# Patient Record
Sex: Male | Born: 1970 | Race: White | Hispanic: No | Marital: Married | Smoking: Current every day smoker
Health system: Southern US, Community
[De-identification: ages and names within clinical notes are randomized; demographics above are authoritative.]

## PROBLEM LIST (undated history)

## (undated) DIAGNOSIS — I10 Essential (primary) hypertension: Secondary | ICD-10-CM

## (undated) DIAGNOSIS — F32A Depression, unspecified: Secondary | ICD-10-CM

## (undated) DIAGNOSIS — F419 Anxiety disorder, unspecified: Secondary | ICD-10-CM

## (undated) DIAGNOSIS — F1010 Alcohol abuse, uncomplicated: Secondary | ICD-10-CM

## (undated) DIAGNOSIS — F329 Major depressive disorder, single episode, unspecified: Secondary | ICD-10-CM

## (undated) HISTORY — PX: FINGER SURGERY: SHX640

---

## 1997-12-24 ENCOUNTER — Emergency Department (HOSPITAL_COMMUNITY): Admission: EM | Admit: 1997-12-24 | Discharge: 1997-12-24 | Payer: Self-pay | Admitting: Emergency Medicine

## 1998-03-20 ENCOUNTER — Ambulatory Visit (HOSPITAL_COMMUNITY): Admission: RE | Admit: 1998-03-20 | Discharge: 1998-03-20 | Payer: Self-pay | Admitting: Internal Medicine

## 2003-08-22 ENCOUNTER — Emergency Department (HOSPITAL_COMMUNITY): Admission: EM | Admit: 2003-08-22 | Discharge: 2003-08-22 | Payer: Self-pay | Admitting: Emergency Medicine

## 2012-09-17 ENCOUNTER — Encounter (HOSPITAL_COMMUNITY): Payer: Self-pay | Admitting: *Deleted

## 2012-09-17 ENCOUNTER — Emergency Department (HOSPITAL_COMMUNITY)
Admission: EM | Admit: 2012-09-17 | Discharge: 2012-09-18 | Disposition: A | Discharge status: Other Acute Inpatient Hospital | Payer: No Typology Code available for payment source | Attending: Emergency Medicine | Admitting: Emergency Medicine

## 2012-09-17 ENCOUNTER — Emergency Department (HOSPITAL_COMMUNITY): Payer: Self-pay

## 2012-09-17 DIAGNOSIS — F17200 Nicotine dependence, unspecified, uncomplicated: Secondary | ICD-10-CM | POA: Insufficient documentation

## 2012-09-17 DIAGNOSIS — F411 Generalized anxiety disorder: Secondary | ICD-10-CM | POA: Insufficient documentation

## 2012-09-17 DIAGNOSIS — F1010 Alcohol abuse, uncomplicated: Secondary | ICD-10-CM | POA: Insufficient documentation

## 2012-09-17 DIAGNOSIS — F329 Major depressive disorder, single episode, unspecified: Secondary | ICD-10-CM | POA: Insufficient documentation

## 2012-09-17 DIAGNOSIS — F3289 Other specified depressive episodes: Secondary | ICD-10-CM | POA: Insufficient documentation

## 2012-09-17 HISTORY — DX: Depression, unspecified: F32.A

## 2012-09-17 HISTORY — DX: Anxiety disorder, unspecified: F41.9

## 2012-09-17 HISTORY — DX: Major depressive disorder, single episode, unspecified: F32.9

## 2012-09-17 HISTORY — DX: Alcohol abuse, uncomplicated: F10.10

## 2012-09-17 LAB — RAPID URINE DRUG SCREEN, HOSP PERFORMED
Amphetamines: NOT DETECTED
Cocaine: NOT DETECTED
Opiates: NOT DETECTED
Tetrahydrocannabinol: NOT DETECTED

## 2012-09-17 LAB — CBC
HCT: 46.5 % (ref 39.0–52.0)
MCH: 37.6 pg — ABNORMAL HIGH (ref 26.0–34.0)
MCHC: 36.3 g/dL — ABNORMAL HIGH (ref 30.0–36.0)
MCV: 103.3 fL — ABNORMAL HIGH (ref 78.0–100.0)
Platelets: 200 10*3/uL (ref 150–400)

## 2012-09-17 LAB — COMPREHENSIVE METABOLIC PANEL
ALT: 72 U/L — ABNORMAL HIGH (ref 0–53)
AST: 109 U/L — ABNORMAL HIGH (ref 0–37)
Albumin: 3.8 g/dL (ref 3.5–5.2)
Calcium: 9.6 mg/dL (ref 8.4–10.5)
Chloride: 97 mEq/L (ref 96–112)
Creatinine, Ser: 0.66 mg/dL (ref 0.50–1.35)
GFR calc non Af Amer: >90 mL/min (ref >90)
Glucose, Bld: 83 mg/dL (ref 70–99)

## 2012-09-17 LAB — URINALYSIS, ROUTINE W REFLEX MICROSCOPIC
Nitrite: NEGATIVE
Protein, ur: 30 mg/dL — AB
Urobilinogen, UA: 1 mg/dL (ref 0.0–1.0)

## 2012-09-17 LAB — ETHANOL: Alcohol, Ethyl (B): <11 mg/dL (ref 0–11)

## 2012-09-17 MED ORDER — LORAZEPAM 1 MG PO TABS: 0.0000 mg | ORAL_TABLET | Freq: Two times a day (BID) | ORAL | Status: DC

## 2012-09-17 MED ORDER — LORAZEPAM 1 MG PO TABS: 1.0000 mg | ORAL_TABLET | Freq: Three times a day (TID) | ORAL | Status: DC | PRN

## 2012-09-17 MED ORDER — VITAMIN B-1 100 MG PO TABS
100.0000 mg | ORAL_TABLET | Freq: Every day | ORAL | Status: DC
  Administered 2012-09-17 – 2012-09-18 (×2): 100 mg via ORAL
  Filled 2012-09-17 (×2): qty 1

## 2012-09-17 MED ORDER — IBUPROFEN 200 MG PO TABS
600.0000 mg | ORAL_TABLET | Freq: Three times a day (TID) | ORAL | Status: DC | PRN
  Administered 2012-09-17: 600 mg via ORAL
  Filled 2012-09-17: qty 3

## 2012-09-17 MED ORDER — ZOLPIDEM TARTRATE 5 MG PO TABS
5.0000 mg | ORAL_TABLET | Freq: Every evening | ORAL | Status: DC | PRN
  Administered 2012-09-17: 5 mg via ORAL
  Filled 2012-09-17: qty 1

## 2012-09-17 MED ORDER — FOLIC ACID 1 MG PO TABS
1.0000 mg | ORAL_TABLET | Freq: Every day | ORAL | Status: DC
  Administered 2012-09-17 – 2012-09-18 (×2): 1 mg via ORAL
  Filled 2012-09-17 (×2): qty 1

## 2012-09-17 MED ORDER — THIAMINE HCL 100 MG/ML IJ SOLN: 100.0000 mg | Freq: Every day | INTRAMUSCULAR | Status: DC

## 2012-09-17 MED ORDER — ALUM & MAG HYDROXIDE-SIMETH 200-200-20 MG/5ML PO SUSP: 30.0000 mL | ORAL | Status: DC | PRN

## 2012-09-17 MED ORDER — NICOTINE 21 MG/24HR TD PT24
21.0000 mg | MEDICATED_PATCH | Freq: Every day | TRANSDERMAL | Status: DC
  Filled 2012-09-17: qty 1

## 2012-09-17 MED ORDER — ADULT MULTIVITAMIN W/MINERALS CH
1.0000 | ORAL_TABLET | Freq: Every day | ORAL | Status: DC
  Administered 2012-09-17 – 2012-09-18 (×2): 1 via ORAL
  Filled 2012-09-17 (×2): qty 1

## 2012-09-17 MED ORDER — ONDANSETRON HCL 4 MG PO TABS: 4.0000 mg | ORAL_TABLET | Freq: Three times a day (TID) | ORAL | Status: DC | PRN

## 2012-09-17 MED ORDER — LORAZEPAM 1 MG PO TABS
0.0000 mg | ORAL_TABLET | Freq: Four times a day (QID) | ORAL | Status: DC
  Administered 2012-09-17: 0 mg via ORAL
  Filled 2012-09-17: qty 1
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
## 2012-09-17 NOTE — Progress Notes (Signed)
[facility] provided pt with a [employer] Orange Card application, highlighting [facility].  °

## 2012-09-17 NOTE — ED Provider Notes (Signed)
CSN: [id number]     Arrival date & time 09/17/12  1402 History   First MD Initiated Contact with Patient 09/17/12 1518     Chief Complaint  Patient presents with  . Medical Clearance   (Consider location/radiation/quality/duration/timing/severity/associated sxs/prior Treatment) HPI Comments: Jonathon Ray is a 42 y.o. male  with a hx of alcohol abuse, anxiety and depression presents to the Emergency Department requesting detox from alcohol abuse. Patient states he drinks 3-5 glasses of vodka every day for the last 4-5 years. 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 He reports that he attempted to stop drinking on his own last week and on Friday had an episode which he characterizes as a seizure. Patient reports he had been 48 hours without alcohol at that time. He states his jaw got tight and he bit his tongue.  Patient reports that the friend who was with him denied seeing any shaking, but patient is certain that he passed out.  He had no loss of bowel or bladder. Patient was not transported for evaluation at that time. He reports no history of seizures. Patient reports he has not seen a primary care doctor in many years and has no known medical problems; however the chart lists anxiety and depression.  He denies associated drug use with his alcohol intake. He also denies suicidal ideation, homicidal ideation, audio and visual hallucinations. Patient's last drink was 2 PM yesterday and was vodka.  Patient reports feeling generally depressed without specifics and thinks this is why he drinks. He requests evaluation for his depression as well.  Patient denies all somatic complaints.  The history is provided by the patient and medical records. No language interpreter was used.    Past Medical History  Diagnosis Date  . Alcohol abuse   . Anxiety   . Depression    Past Surgical History  Procedure Laterality Date  . Finger surgery     No  family history on file. History  Substance Use Topics  . Smoking status: Current Every Day Smoker  . Smokeless tobacco: Never Used  . Alcohol Use: Yes     Comment: 3 drinks of vodka a day    Review of Systems  Constitutional: Negative for fever, diaphoresis, appetite change, fatigue and unexpected weight change.  HENT: Negative for mouth sores and neck stiffness.   Eyes: Negative for visual disturbance.  Respiratory: Negative for cough, chest tightness, shortness of breath and wheezing.   Cardiovascular: Negative for chest pain.  Gastrointestinal: Negative for nausea, vomiting, abdominal pain, diarrhea and constipation.  Endocrine: Negative for polydipsia, polyphagia and polyuria.  Genitourinary: Negative for dysuria, urgency, frequency and hematuria.  Musculoskeletal: Negative for back pain.  Skin: Negative for rash.  Allergic/Immunologic: Negative for immunocompromised state.  Neurological: Negative for syncope, light-headedness and headaches.  Hematological: Does not bruise/bleed easily.  Psychiatric/Behavioral: Negative for sleep disturbance. The patient is not nervous/anxious.     Allergies  Review of patient's allergies indicates no known allergies.  Home Medications   Current Outpatient Rx  Name  Route  Sig  Dispense  Refill  . diphenhydramine-acetaminophen (TYLENOL PM) 25-500 MG TABS   Oral   Take 2 tablets by mouth at bedtime as needed (for sleep).          BP 160/107  Pulse 108  Temp(Src) 99 F (37.2 C) (Oral)  Resp 17  Ht 5\' 8"  (1.727 m)  Wt 197 lb (89.359 kg)  BMI  29.96 kg/m2  SpO2 98% Physical Exam  Nursing note and vitals reviewed. Constitutional: He is oriented to person, place, and time. He appears well-developed and well-nourished. No distress.  Awake, alert, nontoxic appearance  HENT:  Head: Normocephalic and atraumatic.  Mouth/Throat: Oropharynx is clear and moist. No oropharyngeal exudate.  Eyes: Conjunctivae are normal. Pupils are equal,  round, and reactive to light. No scleral icterus.  Neck: Normal range of motion. Neck supple.  Cardiovascular: Normal rate, regular rhythm, normal heart sounds and intact distal pulses.   No murmur heard. Pulmonary/Chest: Effort normal and breath sounds normal. No respiratory distress. He has no wheezes.  Abdominal: Soft. Bowel sounds are normal. He exhibits no distension. There is no tenderness.  Musculoskeletal: Normal range of motion. He exhibits no edema.  Lymphadenopathy:    He has no cervical adenopathy.  Neurological: He is alert and oriented to person, place, and time. He exhibits normal muscle tone. Coordination normal.  Speech is clear and goal oriented, follows commands Major Cranial nerves without deficit, no facial droop Normal strength in upper and lower extremities bilaterally including dorsiflexion and plantar flexion, strong and equal grip strength Sensation normal to light and sharp touch Moves extremities without ataxia, coordination intact Normal finger to nose and rapid alternating movements Neg romberg, no pronator drift Normal gait and balance  Skin: Skin is warm and dry. No rash noted. He is not diaphoretic. No erythema.  Psychiatric: He has a normal mood and affect. His speech is normal and behavior is normal.    ED Course  Procedures (including critical care time) Labs Review Labs Reviewed  CBC - Abnormal; Notable for the following:    MCV 103.3 (*)    MCH 37.6 (*)    MCHC 36.3 (*)    All other components within normal limits  COMPREHENSIVE METABOLIC PANEL - Abnormal; Notable for the following:    AST 109 (*)    ALT 72 (*)    Total Bilirubin 1.3 (*)    All other components within normal limits  SALICYLATE LEVEL - Abnormal; Notable for the following:    Salicylate Lvl <2.0 (*)    All other components within normal limits  URINALYSIS, ROUTINE W REFLEX MICROSCOPIC - Abnormal; Notable for the following:    Color, Urine ORANGE (*)    pH 8.5 (*)     Bilirubin Urine SMALL (*)    Ketones, ur >80 (*)    Protein, ur 30 (*)    Leukocytes, UA SMALL (*)    All other components within normal limits  ACETAMINOPHEN LEVEL  ETHANOL  URINE RAPID DRUG SCREEN (HOSP PERFORMED)  URINE MICROSCOPIC-ADD ON   Imaging Review Ct Head Wo Contrast  09/17/2012   *RADIOLOGY REPORT*  Clinical Data: Recent seizure  CT HEAD WITHOUT CONTRAST  Technique:  Contiguous axial images were obtained from the base of the skull through the vertex without contrast. Study was obtained within 24 hours of patient arrival at the emergency department.  Comparison: None.  Findings: The ventricles are normal in size and configuration. There is no mass, hemorrhage, extra-axial fluid collection, or midline shift.  There is minimal small vessel disease adjacent to the frontal horns of the lateral ventricles bilaterally. Elsewhere, gray-white compartments are normal.  No demonstrable acute infarct.  Bony calvarium appears intact.  The mastoid air cells are clear.  IMPRESSION: Rather minimal periventricular small vessel disease.  Study otherwise unremarkable.   Original Report Authenticated By: Bretta Bang, M.D.    MDM   1. Alcohol  abuse   2. Depression      Garet Hooton presents with request for EtOH detox.  Concern for possible withdrawl seizure last week. Will obtain head Ct before pt is medically cleared.    Head CT negative.  UDS negative, UA without evidence of urinary tract infection, CBC unremarkable, CMP with mildly elevated liver enzymes.  Patient has been medically cleared in the ED and is awaiting consult by ACT team for possible placement vs OP clinic information. Pt is currently not having SI or HI and appears stable in NAD. Pt is cleared to be [facility] back to [facility].    BP 160/107  Pulse 108  Temp(Src) 99 F (37.2 C) (Oral)  Resp 17  Ht 5\' 8"  (1.727 m)  Wt 197 lb (89.359 kg)  BMI 29.96 kg/m2  SpO2 98%   Dierdre Forth, PA-C 09/17/12 2000

## 2012-09-17 NOTE — ED Provider Notes (Signed)
Medical screening examination/treatment/procedure(s) were performed by non-physician practitioner and as supervising physician I was immediately available for consultation/collaboration.  Abir Eroh R. Bryton Waight, MD 09/17/12 2341 

## 2012-09-17 NOTE — ED Provider Notes (Signed)
Patient with hypertension. Unknown the same. We'll give milligram Ativan and reevaluate.  Jonathon Ray. Rubin Payor, MD 09/17/12 1859

## 2012-09-17 NOTE — BH Assessment (Signed)
Consulted with AC Thurman Coyer and Irish Elders who agreed to accept patient to [facility] for detox treatment once his BP is stable and once there is an available bed. TTS will need to seek placement elsewhere.  Spoke with Neysa Bonito at [facility] who reports no male detox beds at this time.   Glorious Peach, MS, LCASA Assessment Counselor

## 2012-09-17 NOTE — ED Notes (Signed)
Pt states has been drinking vodka x years, states usually had 3 drinks a day, states last Friday stopped drinking, states he had a seizure that Friday, then over the weekend started drinking again, states last drink was yesterday around 2 pm, pt states right now he feels like he's withdrawing, states he feels shaky.

## 2012-09-17 NOTE — Treatment Plan (Signed)
Pt accepted to [facility] by Burkett, NP to a 300 hall bed (which is not available tonight) pending stabilization of BP.  Thank you.

## 2012-09-17 NOTE — ED Notes (Signed)
Pt reports he "always has " high BP, but is not on anything

## 2012-09-17 NOTE — ED Notes (Addendum)
Dr pickering aware of bp-give 1 mg ativan for CIWA

## 2012-09-18 ENCOUNTER — Inpatient Hospital Stay (HOSPITAL_COMMUNITY)
Admission: AD | Admit: 2012-09-18 | Discharge: 2012-09-24 | DRG: 897 | Disposition: A | Discharge status: Home / Self Care | Payer: No Typology Code available for payment source | Source: Intra-hospital | Attending: Psychiatry | Admitting: Psychiatry

## 2012-09-18 DIAGNOSIS — F1994 Other psychoactive substance use, unspecified with psychoactive substance-induced mood disorder: Secondary | ICD-10-CM | POA: Diagnosis present

## 2012-09-18 DIAGNOSIS — F321 Major depressive disorder, single episode, moderate: Secondary | ICD-10-CM | POA: Diagnosis present

## 2012-09-18 DIAGNOSIS — F411 Generalized anxiety disorder: Secondary | ICD-10-CM | POA: Diagnosis present

## 2012-09-18 DIAGNOSIS — F17200 Nicotine dependence, unspecified, uncomplicated: Secondary | ICD-10-CM | POA: Diagnosis present

## 2012-09-18 DIAGNOSIS — F1010 Alcohol abuse, uncomplicated: Secondary | ICD-10-CM

## 2012-09-18 DIAGNOSIS — F1020 Alcohol dependence, uncomplicated: Principal | ICD-10-CM

## 2012-09-18 DIAGNOSIS — F3289 Other specified depressive episodes: Secondary | ICD-10-CM

## 2012-09-18 DIAGNOSIS — G4700 Insomnia, unspecified: Secondary | ICD-10-CM | POA: Diagnosis present

## 2012-09-18 DIAGNOSIS — F329 Major depressive disorder, single episode, unspecified: Secondary | ICD-10-CM

## 2012-09-18 MED ORDER — ONDANSETRON HCL 4 MG PO TABS: 4.0000 mg | ORAL_TABLET | Freq: Three times a day (TID) | ORAL | Status: DC | PRN

## 2012-09-18 MED ORDER — ADULT MULTIVITAMIN W/MINERALS CH
1.0000 | ORAL_TABLET | Freq: Every day | ORAL | Status: DC
  Filled 2012-09-18 (×3): qty 1

## 2012-09-18 MED ORDER — ALUM & MAG HYDROXIDE-SIMETH 200-200-20 MG/5ML PO SUSP: 30.0000 mL | ORAL | Status: DC | PRN

## 2012-09-18 MED ORDER — FOLIC ACID 1 MG PO TABS
1.0000 mg | ORAL_TABLET | Freq: Every day | ORAL | Status: DC
  Administered 2012-09-20 – 2012-09-23 (×4): 1 mg via ORAL
  Filled 2012-09-18 (×7): qty 1

## 2012-09-18 MED ORDER — MAGNESIUM HYDROXIDE 400 MG/5ML PO SUSP: 30.0000 mL | Freq: Every day | ORAL | Status: DC | PRN

## 2012-09-18 MED ORDER — ACETAMINOPHEN 325 MG PO TABS
650.0000 mg | ORAL_TABLET | Freq: Four times a day (QID) | ORAL | Status: DC | PRN
  Administered 2012-09-19: 650 mg via ORAL

## 2012-09-18 MED ORDER — VITAMIN B-1 100 MG PO TABS
100.0000 mg | ORAL_TABLET | Freq: Every day | ORAL | Status: DC
  Filled 2012-09-18 (×3): qty 1

## 2012-09-18 MED ORDER — NICOTINE 21 MG/24HR TD PT24
21.0000 mg | MEDICATED_PATCH | Freq: Every day | TRANSDERMAL | Status: DC
  Administered 2012-09-19 – 2012-09-23 (×5): 21 mg via TRANSDERMAL
  Filled 2012-09-18 (×7): qty 1

## 2012-09-18 MED ORDER — LORAZEPAM 1 MG PO TABS: 1.0000 mg | ORAL_TABLET | Freq: Three times a day (TID) | ORAL | Status: DC | PRN

## 2012-09-18 MED ORDER — THIAMINE HCL 100 MG/ML IJ SOLN: 100.0000 mg | Freq: Every day | INTRAMUSCULAR | Status: DC

## 2012-09-18 MED ORDER — ZOLPIDEM TARTRATE 5 MG PO TABS
5.0000 mg | ORAL_TABLET | Freq: Every evening | ORAL | Status: DC | PRN
  Administered 2012-09-18 – 2012-09-19 (×2): 5 mg via ORAL
  Filled 2012-09-18 (×2): qty 1

## 2012-09-18 MED ORDER — LORAZEPAM 1 MG PO TABS
0.0000 mg | ORAL_TABLET | Freq: Four times a day (QID) | ORAL | Status: DC
  Administered 2012-09-18 – 2012-09-19 (×3): 1 mg via ORAL
  Filled 2012-09-18: qty 1
  Filled 2012-09-18: qty 2
  Filled 2012-09-18: qty 1

## 2012-09-18 MED ORDER — LORAZEPAM 1 MG PO TABS: 0.0000 mg | ORAL_TABLET | Freq: Two times a day (BID) | ORAL | Status: DC
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
## 2012-09-18 NOTE — ED Notes (Signed)
Pt reports that Friday was his first and only seizure and it's not sure what the incident was related to exactly. He said he hadn't had ETOH in 24 hours when it occurred but that he's gone 24 hours without ETOH before and didn't have a seizure. He also reports that every time his BP is checked he's told it's high but has never been started on an anti-hypertensive medication nor referred to a cardiologist. He denies SI/HI/AVH. He also denied any withdrawal symptoms.

## 2012-09-18 NOTE — Progress Notes (Signed)
Patient ID: Jonathon Ray, male   DOB: 08-13-1970, 42 y.o.   MRN: [id number] D)  Came up to med window to ask what he had scheduled this evening.  Didn't have anything scheduled at the time, encouraged him to go to group and to come back later for hs meds.  Stated he has been to group before, didn't get anything out of it, doesn't expect to tonight, stated groups don't help.  Encouraged to go and to at least listen.  Denies having w/d sx. Held out hands, slight tremor noted but he stated it isn't related to w/d. Asked about shaving in the am, was told he would need to have a tech present.  Seemed angry at that, stated he was here for detox, not suicide. A)  Will continue to monitor for safety, continue POC, encourage to participate in the milieu. R)  Skeptical, sarcastic, safety maintained

## 2012-09-18 NOTE — Progress Notes (Signed)
Patient ID: Jonathon Ray, male   DOB: 03/20/1970, 42 y.o.   MRN: [id number]  Pt is angry with staff during the whole admission process, pt states "Y'all treat me like I'm in prison, if I had known it would be like this then I would not have come in here for help.  I can stop drinking on my own.  I don't need this horrible treatment.  All of your policy and procedures are stupid."  Pt is resistant to answer questions and is becoming increasingly agitated.  Pt states that he "will get out of here." Pt denies SI/HI/AVH.  Oriented to unit and rules.  Pt seems to be upset with every rule/policy that is in place stating that we aren't "treating him like a human being because I can't even have my belt or walk outside whenever I want to!"  [facility] notified and in search room talking with pt.

## 2012-09-19 DIAGNOSIS — F329 Major depressive disorder, single episode, unspecified: Secondary | ICD-10-CM

## 2012-09-19 DIAGNOSIS — F1020 Alcohol dependence, uncomplicated: Principal | ICD-10-CM

## 2012-09-19 MED ORDER — HYDROXYZINE HCL 25 MG PO TABS: 25.0000 mg | ORAL_TABLET | Freq: Four times a day (QID) | ORAL | Status: AC | PRN

## 2012-09-19 MED ORDER — THIAMINE HCL 100 MG/ML IJ SOLN
100.0000 mg | Freq: Once | INTRAMUSCULAR | Status: AC
  Administered 2012-09-19: 100 mg via INTRAMUSCULAR

## 2012-09-19 MED ORDER — CHLORDIAZEPOXIDE HCL 25 MG PO CAPS
25.0000 mg | ORAL_CAPSULE | ORAL | Status: AC
  Administered 2012-09-22 (×2): 25 mg via ORAL
  Filled 2012-09-19 (×2): qty 1

## 2012-09-19 MED ORDER — CHLORDIAZEPOXIDE HCL 25 MG PO CAPS
25.0000 mg | ORAL_CAPSULE | Freq: Every day | ORAL | Status: AC
  Administered 2012-09-23: 25 mg via ORAL
  Filled 2012-09-19: qty 1

## 2012-09-19 MED ORDER — ONDANSETRON 4 MG PO TBDP: 4.0000 mg | ORAL_TABLET | Freq: Four times a day (QID) | ORAL | Status: AC | PRN

## 2012-09-19 MED ORDER — CHLORDIAZEPOXIDE HCL 25 MG PO CAPS
25.0000 mg | ORAL_CAPSULE | Freq: Four times a day (QID) | ORAL | Status: AC
  Administered 2012-09-19 – 2012-09-20 (×6): 25 mg via ORAL
  Filled 2012-09-19 (×6): qty 1

## 2012-09-19 MED ORDER — VITAMIN B-1 100 MG PO TABS
100.0000 mg | ORAL_TABLET | Freq: Every day | ORAL | Status: DC
  Administered 2012-09-20 – 2012-09-23 (×4): 100 mg via ORAL
  Filled 2012-09-19 (×6): qty 1

## 2012-09-19 MED ORDER — LOPERAMIDE HCL 2 MG PO CAPS: 2.0000 mg | ORAL_CAPSULE | ORAL | Status: AC | PRN

## 2012-09-19 MED ORDER — CHLORDIAZEPOXIDE HCL 25 MG PO CAPS
25.0000 mg | ORAL_CAPSULE | Freq: Four times a day (QID) | ORAL | Status: AC | PRN
  Administered 2012-09-20: 25 mg via ORAL
  Filled 2012-09-19: qty 1

## 2012-09-19 MED ORDER — SERTRALINE HCL 25 MG PO TABS
25.0000 mg | ORAL_TABLET | Freq: Every day | ORAL | Status: DC
  Administered 2012-09-19 – 2012-09-22 (×4): 25 mg via ORAL
  Filled 2012-09-19 (×8): qty 1

## 2012-09-19 MED ORDER — ADULT MULTIVITAMIN W/MINERALS CH
1.0000 | ORAL_TABLET | Freq: Every day | ORAL | Status: DC
  Administered 2012-09-19 – 2012-09-23 (×5): 1 via ORAL
  Filled 2012-09-19 (×7): qty 1

## 2012-09-19 MED ORDER — CHLORDIAZEPOXIDE HCL 25 MG PO CAPS
25.0000 mg | ORAL_CAPSULE | Freq: Three times a day (TID) | ORAL | Status: AC
  Administered 2012-09-21 (×3): 25 mg via ORAL
  Filled 2012-09-19 (×3): qty 1

## 2012-09-19 NOTE — BHH Suicide Risk Assessment (Signed)
Suicide Risk Assessment  Admission Assessment     Nursing information obtained from:    Chart Demographic factors:   Caucasian, Alcohol dependence, Divorced/seperated Current Mental Status:    Blood pressure 121/89, pulse 108, temperature 98 F (36.7 C), temperature source Oral, resp. rate 17, height 5\' 8"  (1.727 m), weight 87.998 kg (194 lb).Body mass index is 29.5 kg/(m^2).   General Appearance: Casual and Well Groomed   Eye Contact:: Good   Speech: Clear and Coherent and Normal Rate   Volume: Normal   Mood: Anxious   Affect: Appropriate, Congruent and Full Range   Thought Process: Circumstantial, Linear and Logical   Orientation: Full (Time, Place, and Person)   Thought Content: WDL   Suicidal Thoughts: No   Homicidal Thoughts: No   Memory: Immediate; Good  Recent; Good  Remote; Good   Judgement: Fair   Insight: Fair   Psychomotor Activity: Normal   Concentration: Fair   Recall: Poor   Akathisia: Negative   Handed: Ambidextrous   AIMS (if indicated): Not indicated   Assets: Communication Skills  Desire for Improvement  Housing   Sleep: Number of Hours: 5.75   Past Psychiatric History:  Diagnosis:   Hospitalizations:   Outpatient Care:   Substance Abuse Care:   Self-Mutilation:   Suicidal Attempts:   Violent Behaviors:    Loss Factors:   recent loss of job. Recent loss of emotional support. Historical Factors:   None Risk Reduction Factors:   Children, no prior suicide attempts  CLINICAL FACTORS:   Depression:   Anhedonia Alcohol/Substance Abuse/Dependencies  COGNITIVE FEATURES THAT CONTRIBUTE TO RISK:  Thought constriction (tunnel vision)    SUICIDE RISK:   Minimal: No identifiable suicidal ideation.  Patients presenting with no risk factors but with morbid ruminations; may be classified as minimal risk based on the severity of the depressive symptoms  PLAN OF CARE: AXIS I:   Substance/Addictive Disorders:  Alcohol Related Disorder - Severe  (303.90) Depressive Disorders:  Depression, Not Elsewhere classified. AXIS II:  No diagnosis AXIS III:   Past Medical History  Diagnosis Date  . Alcohol abuse   . Anxiety   . Depression    AXIS IV:  other psychosocial or environmental problems and problems with primary support group AXIS V:  21-30 behavior considerably influenced by delusions or hallucinations OR serious impairment in judgment, communication OR inability to function in almost all areas  Treatment Plan/Recommendations:  Detox protocol.   Treatment Plan Summary: Daily contact with patient to assess and evaluate symptoms and progress in treatment Medication management Current Medications:  Current Facility-Administered Medications  Medication Dose Route Frequency Provider Last Rate Last Dose  . acetaminophen (TYLENOL) tablet 650 mg  650 mg Oral Q6H PRN Earney Navy, NP   650 mg at 09/19/12 1444  . alum & mag hydroxide-simeth (MAALOX/MYLANTA) 200-200-20 MG/5ML suspension 30 mL  30 mL Oral Q4H PRN Earney Navy, NP      . folic acid (FOLVITE) tablet 1 mg  1 mg Oral Daily Earney Navy, NP      . LORazepam (ATIVAN) tablet 0-4 mg  0-4 mg Oral Q6H Earney Navy, NP   1 mg at 09/19/12 0631   Followed by  . [START ON 09/20/2012] LORazepam (ATIVAN) tablet 0-4 mg  0-4 mg Oral Q12H Josephine C Onuoha, NP      . LORazepam (ATIVAN) tablet 1 mg  1 mg Oral Q8H PRN Earney Navy, NP      . magnesium hydroxide (  MILK OF MAGNESIA) suspension 30 mL  30 mL Oral Daily PRN Earney Navy, NP      . multivitamin with minerals tablet 1 tablet  1 tablet Oral Daily Earney Navy, NP      . nicotine (NICODERM CQ - dosed in mg/24 hours) patch 21 mg  21 mg Transdermal Daily Earney Navy, NP      . ondansetron (ZOFRAN) tablet 4 mg  4 mg Oral Q8H PRN Earney Navy, NP      . thiamine (VITAMIN B-1) tablet 100 mg  100 mg Oral Daily Earney Navy, NP       Or  . thiamine (B-1) injection 100 mg  100 mg  Intravenous Daily Earney Navy, NP      . zolpidem (AMBIEN) tablet 5 mg  5 mg Oral QHS PRN Earney Navy, NP   5 mg at 09/18/12 2152    Observation Level/Precautions:  Detox  Laboratory:  Reviewed  Psychotherapy:  Group and AA  Medications:  Will start detox protocol. Will start sertraline 25 mg now, for depressive symptoms.  Consultations:  none  Discharge Concerns:  Relapse  Estimated LOS: 3-5 days  Other:  Referral to [facility] or other rehabilitation facility.     I certify that inpatient services furnished can reasonably be expected to improve the patient's condition.  Jonathon Ray 09/19/2012, 3:20 PM
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
## 2012-09-19 NOTE — Progress Notes (Signed)
Psychoeducational Group Note  Date:  09/19/2012 Time:  0945 am  Group Topic/Focus:  Identifying Needs:   The focus of this group is to help patients identify their personal needs that have been historically problematic and identify healthy behaviors to address their needs.  Participation Level:  Active  Participation Quality:  Appropriate  Affect:  Appropriate  Cognitive:  Alert  Insight:  Improving  Engagement in Group:  Improving  Additional Comments:    Andrena Mews 09/19/2012,6:35 PM

## 2012-09-19 NOTE — Progress Notes (Signed)
Adult Psychoeducational Group Note  Date:  09/19/2012 Time:  0830  Group Topic/Focus:  Goals Group:   The focus of this group is to help patients establish daily goals to achieve during treatment and discuss how the patient can incorporate goal setting into their daily lives to aide in recovery.  Participation Level:  Active  Participation Quality:  Appropriate  Affect:  Appropriate  Cognitive:  Appropriate  Insight: Appropriate  Engagement in Group:  Engaged  Modes of Intervention:  Discussion  Additional Comments:    Roselee Culver 09/19/2012, 10:16 AM

## 2012-09-19 NOTE — [facility] Group Notes (Signed)
[facility] Group Notes:  (Clinical Social Work)  09/19/2012     10-11AM  Summary of Progress/Problems:   The main focus of today's process group was for the patient to identify ways in which they have in the past sabotaged their own recovery. Motivational Interviewing was utilized to ask the group members what they get out of their substance use, and what reasons they may have for wanting to change.  The Stages of Change were explained using a handout, and patients identified where they currently are with regard to stages of change.  The patient expressed that he is a functioning alcoholic, that he started out drinking for depression and anxiety and continued because he is a sociable person.  Now he has a girlfriend and 2 sons, is getting older and wants to change.  Believes he is in Preparation Stage.  Type of Therapy:  Group Therapy - Process   Participation Level:  Minimal  Participation Quality:  Attentive  Affect:  Flat  Cognitive:  Oriented  Insight:  Developing/Improving  Engagement in Therapy:  Developing/Improving  Modes of Intervention:  Education, Support and Processing, Motivational Interviewing  Ambrose Mantle, LCSW 09/19/2012, 12:55 PM

## 2012-09-19 NOTE — H&P (Addendum)
Psychiatric Admission Assessment Adult  Patient Identification:  Jonathon Ray Date of Evaluation:  09/19/2012 Chief Complaint:  ALCOHOL DEPENDENCE ETOH 303.90  History of Present Illness: Mr. Jonathon Ray is a 42 y/o male.  Per his ED psychiatric assessment. "Jonathon Ray is a 42 y.o. male with a hx of alcohol abuse, anxiety and depression presents to the Emergency Department requesting detox from alcohol abuse. Patient states he drinks 3-5 glasses of vodka every day for the last 4-5 years. 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 He reports that he attempted to stop drinking on his own last week and on Friday and he described what look as a seizure. Patient reports he had been 48 hours without alcohol at that time. He states his jaw got tight and he bit his tongue. He had no loss of bowel or bladder. Patient was not transported for evaluation at that time. He reports no history of seizures. Patient reports he has not seen a primary care doctor in many years and has no known medical problems; however, he reports depressed mood for most of life and anxiety especially while withdrawing from alcohol. He denies associated drug use with his alcohol intake. He also denies suicidal ideation, homicidal ideation, audio and visual hallucinations. Patient's last drink was 2 PM yesterday and was vodka. Patient reports He drinks to treat his depression. He does not have a psychiatrist and denies been treated for alcohol in the past. His motivation for seeking treatment now is to be able to be able to go out and look for a new job. During the interview patient's affect was flat, angry and sad."    Elements:  Location:  The patient reports he has been drinking since he was drinking since age 67. He went through a seperation 4 years ago.. Quality:  The patient endorses a worsening of his mood over the past 5 month when his girlfriend ended the  relationship.. Severity: Depression: 5/10 (0=Very depressed; 5=Neutral; 10=Very Happy)  Anxiety- 7/10 (0=no anxiety; 5= moderate/tolerable anxiety; 10= panic attacks) Timing:  The patient denies any specific time. Duration: The patient has had symptoms of depression for the past 4 years after his separation from his wife and gradually worsened over the past 5 months after his girlfriend ended the relationship. Context:  Alcohol use.   Associated Signs/Synptoms: Depression Symptoms:  depressed mood, anhedonia, hopelessness, suicidal thoughts without plan, (Hypo) Manic Symptoms: None Anxiety Symptoms:  Excessive Worry, Psychotic Symptoms:  Patient denies PTSD Symptoms: Negative  Psychiatric Specialty Exam: Physical Exam I have reviewed the physical finding performed in the ED and  Agree with the findings.  ROS  Blood pressure 121/89, pulse 108, temperature 98 F (36.7 C), temperature source Oral, resp. rate 17, height 5\' 8"  (1.727 m), weight 87.998 kg (194 lb).Body mass index is 29.5 kg/(m^2).  General Appearance: Casual and Well Groomed  Eye Contact::  Good  Speech:  Clear and Coherent and Normal Rate  Volume:  Normal  Mood:  Anxious  Affect:  Appropriate, Congruent and Full Range  Thought Process:  Circumstantial, Linear and Logical  Orientation:  Full (Time, Place, and Person)  Thought Content:  WDL  Suicidal Thoughts:  No  Homicidal Thoughts:  No  Memory:  Immediate;   Good Recent;   Good Remote;   Good  Judgement:  Fair  Insight:  Fair  Psychomotor Activity:  Normal  Concentration:  Fair  Recall:  Poor  Akathisia:  Negative  Handed:  Ambidextrous  AIMS (if indicated):   Not indicated  Assets:  Communication Skills Desire for Improvement Housing  Sleep:  Number of Hours: 5.75    Past Psychiatric History: Diagnosis:  Hospitalizations:  Outpatient Care:  Substance Abuse Care:  Self-Mutilation:  Suicidal Attempts:  Violent Behaviors:   Past Medical History:    Past Medical History  Diagnosis Date  . Alcohol abuse   . Anxiety   . Depression    Loss of Consciousness:  With drinking Seizure History:  Patient is unsure Cardiac History:  Patient denies Traumatic Brain Injury:  Patient was a boxer Allergies:  No Known Allergies PTA Medications: Prescriptions prior to admission  Medication Sig Dispense Refill  . diphenhydramine-acetaminophen (TYLENOL PM) 25-500 MG TABS Take 2 tablets by mouth at bedtime as needed (for sleep).        Previous Psychotropic Medications:  Medication/Dose  None               Substance Abuse History in the last 12 months:  yes  Consequences of Substance Abuse: Medical Consequences:  Yes Legal Consequences:  No Family Consequences:  Yes Blackouts:   DT's: Withdrawal Symptoms:   Diarrhea Headaches Nausea Vomiting  Social History:  reports that he has been smoking.  He has never used smokeless tobacco. He reports that  drinks alcohol. He reports that he does not use illicit drugs. Additional Social History:  Current Place of Residence:   Family Members: Lives by himself. Marital Status:  Separated Children: 2  Sons:2 Relationships: No source of emotional support. Education:  Cardinal Health Problems/Performance:  none History of Abuse (Emotional/Phsycial/Sexual): Patient denies. 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 Legal History: None  Family History:  No family history on file.  Results for orders placed during the hospital encounter of 09/17/12 (from the past 72 hour(s))  ACETAMINOPHEN LEVEL     Status: None   Collection Time    09/17/12  2:45 PM      Result Value Range   Acetaminophen (Tylenol), Serum <15.0  10 - 30 ug/mL   Comment:            THERAPEUTIC CONCENTRATIONS VARY     SIGNIFICANTLY. A RANGE OF 10-30     ug/mL MAY BE AN EFFECTIVE     CONCENTRATION FOR MANY PATIENTS.     HOWEVER, SOME ARE BEST TREATED     AT CONCENTRATIONS OUTSIDE THIS      RANGE.     ACETAMINOPHEN CONCENTRATIONS     >150 ug/mL AT 4 HOURS AFTER     INGESTION AND >50 ug/mL AT 12     HOURS AFTER INGESTION ARE     OFTEN ASSOCIATED WITH TOXIC     REACTIONS.  CBC     Status: Abnormal   Collection Time    09/17/12  2:45 PM      Result Value Range   WBC 6.5  4.0 - 10.5 K/uL   RBC 4.50  4.22 - 5.81 MIL/uL   Hemoglobin 16.9  13.0 - 17.0 g/dL   HCT 09.8  11.9 - 14.7 %   MCV 103.3 (*) 78.0 - 100.0 fL   MCH 37.6 (*) 26.0 - 34.0 pg   MCHC 36.3 (*) 30.0 - 36.0 g/dL   RDW 82.9  56.2 - 13.0 %   Platelets 200  150 - 400 K/uL  COMPREHENSIVE METABOLIC PANEL     Status: Abnormal   Collection Time    09/17/12  2:45 PM  Result Value Range   Sodium 135  135 - 145 mEq/L   Potassium 4.3  3.5 - 5.1 mEq/L   Chloride 97  96 - 112 mEq/L   CO2 28  19 - 32 mEq/L   Glucose, Bld 83  70 - 99 mg/dL   BUN 9  6 - 23 mg/dL   Creatinine, Ser 8.11  0.50 - 1.35 mg/dL   Calcium 9.6  8.4 - 91.4 mg/dL   Total Protein 6.8  6.0 - 8.3 g/dL   Albumin 3.8  3.5 - 5.2 g/dL   AST 782 (*) 0 - 37 U/L   ALT 72 (*) 0 - 53 U/L   Alkaline Phosphatase 75  39 - 117 U/L   Total Bilirubin 1.3 (*) 0.3 - 1.2 mg/dL   GFR calc non Af Amer >90  >90 mL/min   GFR calc Af Amer >90  >90 mL/min   Comment: (NOTE)     The eGFR has been calculated using the CKD EPI equation.     This calculation has not been validated in all clinical situations.     eGFR's persistently <90 mL/min signify possible Chronic Kidney     Disease.  ETHANOL     Status: None   Collection Time    09/17/12  2:45 PM      Result Value Range   Alcohol, Ethyl (B) <11  0 - 11 mg/dL   Comment:            LOWEST DETECTABLE LIMIT FOR     SERUM ALCOHOL IS 11 mg/dL     FOR MEDICAL PURPOSES ONLY  SALICYLATE LEVEL     Status: Abnormal   Collection Time    09/17/12  2:45 PM      Result Value Range   Salicylate Lvl <2.0 (*) 2.8 - 20.0 mg/dL  URINE RAPID DRUG SCREEN (HOSP PERFORMED)     Status: None   Collection Time    09/17/12  3:43 PM       Result Value Range   Opiates NONE DETECTED  NONE DETECTED   Cocaine NONE DETECTED  NONE DETECTED   Benzodiazepines NONE DETECTED  NONE DETECTED   Amphetamines NONE DETECTED  NONE DETECTED   Tetrahydrocannabinol NONE DETECTED  NONE DETECTED   Barbiturates NONE DETECTED  NONE DETECTED   Comment:            DRUG SCREEN FOR MEDICAL PURPOSES     ONLY.  IF CONFIRMATION IS NEEDED     FOR ANY PURPOSE, NOTIFY LAB     WITHIN 5 DAYS.                LOWEST DETECTABLE LIMITS     FOR URINE DRUG SCREEN     Drug Class       Cutoff (ng/mL)     Amphetamine      1000     Barbiturate      200     Benzodiazepine   200     Tricyclics       300     Opiates          300     Cocaine          300     THC              50  URINALYSIS, ROUTINE W REFLEX MICROSCOPIC     Status: Abnormal   Collection Time    09/17/12  3:43 PM  Result Value Range   Color, Urine ORANGE (*) YELLOW   Comment: BIOCHEMICALS MAY BE AFFECTED BY COLOR   APPearance CLEAR  CLEAR   Specific Gravity, Urine 1.023  1.005 - 1.030   pH 8.5 (*) 5.0 - 8.0   Glucose, UA NEGATIVE  NEGATIVE mg/dL   Hgb urine dipstick NEGATIVE  NEGATIVE   Bilirubin Urine SMALL (*) NEGATIVE   Ketones, ur >80 (*) NEGATIVE mg/dL   Protein, ur 30 (*) NEGATIVE mg/dL   Urobilinogen, UA 1.0  0.0 - 1.0 mg/dL   Nitrite NEGATIVE  NEGATIVE   Leukocytes, UA SMALL (*) NEGATIVE  URINE MICROSCOPIC-ADD ON     Status: None   Collection Time    09/17/12  3:43 PM      Result Value Range   WBC, UA 3-6  <3 WBC/hpf   Urine-Other MUCOUS PRESENT     Psychological Evaluations:  Assessment:   DSM5:  Substance/Addictive Disorders:  Alcohol Related Disorder - Severe (303.90) Depressive Disorders:  Depression, Not Elsewhere classified.  AXIS I:   Substance/Addictive Disorders:  Alcohol Related Disorder - Severe (303.90) Depressive Disorders:  Depression, Not Elsewhere classified. AXIS II:  No diagnosis AXIS III:   Past Medical History  Diagnosis Date  . Alcohol  abuse   . Anxiety   . Depression    AXIS IV:  other psychosocial or environmental problems and problems with primary support group AXIS V:  21-30 behavior considerably influenced by delusions or hallucinations OR serious impairment in judgment, communication OR inability to function in almost all areas  Treatment Plan/Recommendations:  Detox protocol.   Treatment Plan Summary: Daily contact with patient to assess and evaluate symptoms and progress in treatment Medication management Current Medications:  Current Facility-Administered Medications  Medication Dose Route Frequency Provider Last Rate Last Dose  . acetaminophen (TYLENOL) tablet 650 mg  650 mg Oral Q6H PRN Earney Navy, NP   650 mg at 09/19/12 1444  . alum & mag hydroxide-simeth (MAALOX/MYLANTA) 200-200-20 MG/5ML suspension 30 mL  30 mL Oral Q4H PRN Earney Navy, NP      . folic acid (FOLVITE) tablet 1 mg  1 mg Oral Daily Earney Navy, NP      . LORazepam (ATIVAN) tablet 0-4 mg  0-4 mg Oral Q6H Earney Navy, NP   1 mg at 09/19/12 0631   Followed by  . [START ON 09/20/2012] LORazepam (ATIVAN) tablet 0-4 mg  0-4 mg Oral Q12H Earney Navy, NP      . LORazepam (ATIVAN) tablet 1 mg  1 mg Oral Q8H PRN Earney Navy, NP      . magnesium hydroxide (MILK OF MAGNESIA) suspension 30 mL  30 mL Oral Daily PRN Earney Navy, NP      . multivitamin with minerals tablet 1 tablet  1 tablet Oral Daily Earney Navy, NP      . nicotine (NICODERM CQ - dosed in mg/24 hours) patch 21 mg  21 mg Transdermal Daily Earney Navy, NP      . ondansetron (ZOFRAN) tablet 4 mg  4 mg Oral Q8H PRN Earney Navy, NP      . thiamine (VITAMIN B-1) tablet 100 mg  100 mg Oral Daily Earney Navy, NP       Or  . thiamine (B-1) injection 100 mg  100 mg Intravenous Daily Earney Navy, NP      . zolpidem (AMBIEN) tablet 5 mg  5 mg Oral QHS PRN Julieanne Cotton  Doree Albee, NP   5 mg at 09/18/12 2152     Observation Level/Precautions:  Detox  Laboratory:  Reviewed  Psychotherapy:  Group and AA  Medications:  Will start detox protocol. Will start sertraline 25 mg now, for depressive symptoms.  Consultations:  none  Discharge Concerns:  Relapse  Estimated LOS: 3-5 days  Other:  Referral to [facility] or other rehabilitation facility.   I certify that inpatient services furnished can reasonably be expected to improve the patient's condition.   Khalik Pewitt, Otelia Santee 9/13/20143:19 PM

## 2012-09-19 NOTE — Progress Notes (Signed)
D patient did not fill out the self inventory sheet, did attend group this morning and participate, has not taken his meds today, hasn't asked about them, feels entitled, believes our policies and procedures are stupid, denies Si or HI and has no AVH today, depressed mood and flat affect.eating in DR.  A q7min safety checks continue and support offered, encouraged to go to group and participate R patient remains safe on the unit

## 2012-09-20 DIAGNOSIS — F1010 Alcohol abuse, uncomplicated: Secondary | ICD-10-CM

## 2012-09-20 MED ORDER — TRAZODONE HCL 50 MG PO TABS
50.0000 mg | ORAL_TABLET | Freq: Every evening | ORAL | Status: DC | PRN
  Administered 2012-09-20 – 2012-09-21 (×3): 50 mg via ORAL
  Filled 2012-09-20 (×10): qty 1

## 2012-09-20 NOTE — Progress Notes (Signed)
Patient ID: Jonathon Ray, male   DOB: December 22, 1970, 42 y.o.   MRN: [id number] D)  Agreed to go to group this evening, seems less agitated and less irritable than he had been, more cooperative.  States the librium is helping.  Has been out in the dayroom, watching tv and interacting a little more with peers and staff, beginning to ask about detox and longer term programs available, although only thinking of going for about 2 weeks.  Denies having w/d sx other than anxiety.  A)  Will continue to monitor for safety, continue POC R)  Safety maintained.

## 2012-09-20 NOTE — Progress Notes (Signed)
Patient ID: Jonathon Ray, male   DOB: 1970-05-25, 42 y.o.   MRN: [id number] 09-20-12 nursing shift note: D: pt has been visible in the milieu, interacting with his peers and going to groups. His anxiety and depression are decreasing and he is less irritable. His brother called and wanted an update regarding his progress.A: RN checked the pt's call list,brother gave his code and RN discussed the pt's care with the brother, [name]. Patient stated that he is wanting to go to a long term treatment facility after discharge from [facility]. R: he denied any si/hi/av. On his inventory sheet he wrote: slept fair but requested sleep medication, appetite improving, energy normal, attention improving with depression "na" and didn't address his hopelessness. W/d symptoms have been chilling, with his physical problems in the last 24 hrs being lightheadedness.his plans after discharge are to "not drink". RN will continue to monitor and Q 15 min ck's continue.

## 2012-09-20 NOTE — Progress Notes (Signed)
[facility] MD Progress Note  09/20/2012 12:09 PM Jonathon Ray  MRN:  [id number]  Subjective:  Jonathon Ray reports that his mood is "fine." He rates his depression as a 3 on a scale of 1-10 where 10 is the worst. He rates his anxiety as a 4 on a scale of 1-10. He denies any suicidal or homicidal ideation. He denies any auditory or visual hallucinations. He denies any withdrawal symptoms or cravings for alcohol. He reports his appetite is good and his sleep is fair. He expresses a desire to go to a treatment facility after discharge, and spoke of possibly going to [facility] or [facility].  Diagnosis:   DSM5: Substance/Addictive Disorders: Alcohol Related Disorder - Severe (303.90)  Depressive Disorders: Depression, Not Elsewhere classified.  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 Alcohol abuse   . Anxiety   . Depression     ADL's:  Intact  Sleep: Fair  Appetite:  Good  Suicidal Ideation:  Patient denies any thought, plan, or intent Homicidal Ideation:  Patient denies any thought, plan, or intent AEB (as evidenced by):  Psychiatric Specialty Exam: Review of Systems  Constitutional: Negative.   HENT: Negative.   Eyes: Negative.   Respiratory: Negative.   Cardiovascular: Negative.   Gastrointestinal: Negative.   Genitourinary: Negative.   Musculoskeletal: Negative.   Skin: Negative.   Neurological: Negative.   Endo/Heme/Allergies: Negative.   Psychiatric/Behavioral: Positive for depression and substance abuse. Negative for suicidal ideas and hallucinations. The patient is nervous/anxious and has insomnia.     Blood pressure 157/115, pulse 80, temperature 98.5 F (36.9 C), temperature source Oral, resp. rate 18, height 5\' 8"  (1.727 m), weight 87.998 kg (194 lb).Body mass index is 29.5 kg/(m^2).  General Appearance: Casual  Eye Contact::  Good  Speech:  Clear and Coherent  Volume:  Normal  Mood:  Dysphoric   Affect:  Congruent  Thought Process:  Linear  Orientation:  Full (Time, Place, and Person)  Thought Content:  WDL  Suicidal Thoughts:  No  Homicidal Thoughts:  No  Memory:  Immediate;   Fair Recent;   Fair Remote;   Fair  Judgement:  Fair  Insight:  Fair  Psychomotor Activity:  Normal  Concentration:  Good  Recall:  Good  Akathisia:  No  Handed:  Right  AIMS (if indicated):     Assets:  Communication Skills Desire for Improvement Social Support Vocational/Educational  Sleep:  Number of Hours: 5.25   Current Medications: Current Facility-Administered Medications  Medication Dose Route Frequency Provider Last Rate Last Dose  . acetaminophen (TYLENOL) tablet 650 mg  650 mg Oral Q6H PRN Earney Navy, NP   650 mg at 09/19/12 1444  . alum & mag hydroxide-simeth (MAALOX/MYLANTA) 200-200-20 MG/5ML suspension 30 mL  30 mL Oral Q4H PRN Earney Navy, NP      . chlordiazePOXIDE (LIBRIUM) capsule 25 mg  25 mg Oral Q6H PRN Larena Sox, MD   25 mg at 09/20/12 0254  . chlordiazePOXIDE (LIBRIUM) capsule 25 mg  25 mg Oral QID Larena Sox, MD   25 mg at 09/20/12 1132   Followed by  . [START ON 09/21/2012] chlordiazePOXIDE (LIBRIUM) capsule 25 mg  25 mg Oral TID Larena Sox, MD       Followed by  . [START ON 09/22/2012] chlordiazePOXIDE (LIBRIUM) capsule 25 mg  25 mg Oral BH-qamhs Larena Sox, MD  Followed by  . [START ON 09/23/2012] chlordiazePOXIDE (LIBRIUM) capsule 25 mg  25 mg Oral Daily Larena Sox, MD      . folic acid (FOLVITE) tablet 1 mg  1 mg Oral Daily Earney Navy, NP   1 mg at 09/20/12 0756  . hydrOXYzine (ATARAX/VISTARIL) tablet 25 mg  25 mg Oral Q6H PRN Larena Sox, MD      . loperamide (IMODIUM) capsule 2-4 mg  2-4 mg Oral PRN Larena Sox, MD      . magnesium hydroxide (MILK OF MAGNESIA) suspension 30 mL  30 mL Oral Daily PRN Earney Navy, NP      . multivitamin with minerals tablet 1 tablet  1 tablet Oral Daily Larena Sox, MD   1 tablet at 09/20/12 0756  . nicotine (NICODERM CQ - dosed in mg/24 hours) patch 21 mg  21 mg Transdermal Daily Earney Navy, NP   21 mg at 09/20/12 0758  . ondansetron (ZOFRAN) tablet 4 mg  4 mg Oral Q8H PRN Earney Navy, NP      . ondansetron (ZOFRAN-ODT) disintegrating tablet 4 mg  4 mg Oral Q6H PRN Larena Sox, MD      . sertraline (ZOLOFT) tablet 25 mg  25 mg Oral Daily Larena Sox, MD   25 mg at 09/20/12 0756  . thiamine (VITAMIN B-1) tablet 100 mg  100 mg Oral Daily Larena Sox, MD   100 mg at 09/20/12 0757  . traZODone (DESYREL) tablet 50 mg  50 mg Oral QHS,MR X 1 Jorje Guild, PA-C        Lab Results: No results found for this or any previous visit (from the past 48 hour(s)).  Physical Findings: AIMS:  , ,  ,  ,    CIWA:  CIWA-Ar Total: 2 COWS:     Treatment Plan Summary: Daily contact with patient to assess and evaluate symptoms and progress in treatment Medication management  Plan: Review of chart, vital signs, medications, and notes.  1-Individual and group therapy  2-Medication management for depression and anxiety: Discontinue Ambien and initiate trazodone for sleep 3-Coping skills for depression, anxiety, and substance abuse  4-Continue crisis stabilization and management  5-Address health issues--monitoring vital signs, stable  6-Treatment plan in progress to prevent relapse of depression and anxiety   Medical Decision Making Problem Points:  Established problem, stable/improving (1) and Review of psycho-social stressors (1) Data Points:  Review or order clinical lab tests (1) Review of new medications or change in dosage (2)  I certify that inpatient services furnished can reasonably be expected to improve the patient's condition.   [name],[name] 09/20/2012, 12:09 PM   Reviewed note, agree with findings and plan.  Jacqulyn Cane, M.D.  09/21/2012 6:32 AM

## 2012-09-20 NOTE — Progress Notes (Signed)
[facility] Group Notes:  (Nursing/MHT/Case Management/Adjunct)  Date:  09/19/2012  Time:  2100 Type of Therapy:  wrap up group  Participation Level:  Active  Participation Quality:  Appropriate, Attentive, Sharing and Supportive  Affect:  Appropriate  Cognitive:  Appropriate  Insight:  Appropriate  Engagement in Group:  Engaged  Modes of Intervention:  Clarification, Education and Support  Summary of Progress/Problems: Pt reports being in hell and wanting to get back into the lord's good graces.  Pt reports he is "beginning his journey" of sobriety. Pt confirms that this is his first try at sobriety and wants to return to normalcy, waking up in the triad, and spending time with his 41 and 86 year old sons.   Shelah Lewandowsky 09/20/2012, 12:00 AM

## 2012-09-20 NOTE — Progress Notes (Signed)
Psychoeducational Group Note  Date:  09/20/2012 Time:  0945 am  Group Topic/Focus:  Making Healthy Choices:   The focus of this group is to help patients identify negative/unhealthy choices they were using prior to admission and identify positive/healthier coping strategies to replace them upon discharge.  Participation Level:  Active  Participation Quality:  Appropriate  Affect:  Appropriate  Cognitive:  Appropriate  Insight:  Developing/Improving  Engagement in Group:  Developing/Improving  Additional Comments:    Adrain Butrick J 09/20/2012, 10:29 AM 

## 2012-09-20 NOTE — Progress Notes (Signed)
Patient ID: Jonathon Ray, male   DOB: 1970/10/18, 42 y.o.   MRN: [id number] Psychoeducational Group Note  Date:  09/20/2012 Time:  0900am  Group Topic/Focus:  Making Healthy Choices:   The focus of this group is to help patients identify negative/unhealthy choices they were using prior to admission and identify positive/healthier coping strategies to replace them upon discharge.  Participation Level:  Active  Participation Quality:  Appropriate and Attentive  Affect:  Appropriate  Cognitive:  Appropriate  Insight:  Supportive  Engagement in Group:  Supportive  Additional Comments:  Inventory sheet group   Valente David 09/20/2012,9:42 AM

## 2012-09-20 NOTE — [facility] Group Notes (Signed)
[facility] Group Notes:  (Clinical Social Work)  09/20/2012  10:00-11:00AM  Summary of Progress/Problems:   The main focus of today's process group was to   identify the patient's current support system and decide on other supports that can be put in place.  The picture on workbook was used to discuss why additional supports are needed, and a hand-out was distributed with four definitions/levels of support, then used to talk about how patients have given and received all different kinds of support.  An emphasis was placed on using counselor, doctor, therapy groups, 12-step groups, and problem-specific support groups to expand supports.  The patient identified current supports in place and expressed a willingness to add therapy.  He gave feedback to other patients, was very attentive and appeared to be quite familiar with AA and willing to return there.  Type of Therapy:  Process Group with Motivational Interviewing  Participation Level:  Active  Participation Quality:  Attentive and Supportive  Affect:  Blunted  Cognitive:  Oriented  Insight:  Engaged  Engagement in Therapy:  Engaged  Modes of Intervention:   Education, Support and Processing, Activity  [employer], LCSW 09/20/2012, 12:18 PM

## 2012-09-21 DIAGNOSIS — F329 Major depressive disorder, single episode, unspecified: Secondary | ICD-10-CM | POA: Diagnosis present

## 2012-09-21 DIAGNOSIS — F1020 Alcohol dependence, uncomplicated: Secondary | ICD-10-CM | POA: Diagnosis present

## 2012-09-21 DIAGNOSIS — F1994 Other psychoactive substance use, unspecified with psychoactive substance-induced mood disorder: Secondary | ICD-10-CM

## 2012-09-21 DIAGNOSIS — F3289 Other specified depressive episodes: Secondary | ICD-10-CM | POA: Diagnosis present

## 2012-09-21 MED ORDER — QUETIAPINE FUMARATE 50 MG PO TABS
50.0000 mg | ORAL_TABLET | Freq: Every day | ORAL | Status: DC
  Administered 2012-09-21: 50 mg via ORAL
  Filled 2012-09-21 (×4): qty 1

## 2012-09-21 NOTE — Tx Team (Signed)
Interdisciplinary Treatment Plan Update (Adult)  Date: 09/21/2012   Time Reviewed: 10:43 AM  Progress in Treatment:  Attending groups: Yes Participating in groups:  Yes Taking medication as prescribed: Yes  Tolerating medication: Yes  Family/Significant othe contact made: No. SPE not required for this pt.  Patient understands diagnosis: Yes, AEB seeking treatment for depression and ETOH detox.  Discussing patient identified problems/goals with staff: Yes  Medical problems stabilized or resolved: Yes  Denies suicidal/homicidal ideation: Yes during admission, group, and self report.  Patient has not harmed self or Others: Yes  New problem(s) identified: n/a  Discharge Plan or Barriers: Pt hoping for admission into [facility] or [facility] and plans to go to [facility] for med management. He currently lives with friend in unstable and unsupportive environment.  Additional comments: Jonathon Ray is a 42 y.o. male with a hx of alcohol abuse, anxiety and depression presents to the Emergency Department requesting detox from alcohol abuse. Patient states he drinks 3-5 glasses of vodka every day for the last 4-5 years. 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 He reports that he attempted to stop drinking on his own last week and on Friday and he described what look as a seizure. Patient reports he had been 48 hours without alcohol at that time. He states his jaw got tight and he bit his tongue. He had no loss of bowel or bladder. Patient was not transported for evaluation at that time. He reports no history of seizures. Patient reports he has not seen a primary care doctor in many years and has no known medical problems; however, he reports depressed mood for most of life and anxiety especially while withdrawing from alcohol. He denies associated drug use with his alcohol intake. He also denies suicidal ideation, homicidal ideation, audio and visual  hallucinations.  Reason for Continuation of Hospitalization: Librium taper-withdrawals Medication management Mood stabilization Estimated length of stay: 3-5 days  For review of initial/current patient goals, please see plan of care.  Attendees:  Patient:    Family:    Physician: Geoffery Lyons MD 09/21/2012 10:44 AM   Nursing: Lupita Leash RN 09/21/2012 10:44 AM   Clinical Social Worker Ellis Koffler Smart, LCSWA  09/21/2012 10:44 AM   Other: Aggie RN 09/21/2012 10:44 AM   Other: Darden Dates Nurse CM 09/21/2012 10:45 AM   Other: Massie Kluver, Community Care Coordinator  09/21/2012 10:45 AM   Other: Sue Lush RN 09/21/2012 10:45 AM   Scribe for Treatment Team:  Herbert Seta Smart LCSWA  09/21/2012 10:45 AM

## 2012-09-21 NOTE — [facility] Group Notes (Signed)
[facility] LCSW Aftercare Discharge Planning Group Note   09/21/2012 10:32 AM  Participation Quality:  Appropriate   Mood/Affect:  Appropriate  Depression Rating:  6  Anxiety Rating:  6  Thoughts of Suicide:  No Will you contract for safety?   NA  Current AVH:  No  Plan for Discharge/Comments:  Pt reports that he came to hospital seeking detox and currently lives with a friend in a negative environment. Pt currently sees no mental health providers and is interested in going to [facility] after d/c. He plans to followup at [facility] for med management.   Transportation Means: friend   Supports: none identified at this time.   Smart, [employer]

## 2012-09-21 NOTE — Progress Notes (Signed)
D:  Per pt self inventory pt reports sleeping fair, appetite improving, energy level low, ability to pay attention improving, rates depression at 0 out of 10 and hopelessness at a 0 out of 10, pt denies SI/HI/AVH, no complaints at this time.      A:  Emotional support provided, Encouraged pt to continue with treatment plan and attend all group activities, q15 min checks maintained for safety.  R:  Pt is receptive, calm and cooperative, pleasant with staff and interactive in the milieu, pt attends groups and seems to have insight about recovery/treatment and is taking an active part in his treatment plan.  Pt apologized for his "behavior and attitude" on the day he was admitted.  Pt wants long term treatment after discharging here.

## 2012-09-21 NOTE — Progress Notes (Signed)
Patient ID: Jonathon Ray, male   DOB: April 08, 1970, 42 y.o.   MRN: [id number] D)  Has been out and about on the hall and in the dayroom, watched football on tv prior to group, which he attended.  Has been pleasant,  Seems brighter and feels he is doing better with detox, no c/o's of discomfort at this time,  States tremors have improved, feels less irritable, doing better overall, hoping trazodone will help with sleep.  Expressed interest in long term programs after discharge. A)  Will continue to monitor for safety, continue POC R)  Safety maintained.

## 2012-09-21 NOTE — [facility] Group Notes (Signed)
[facility] LCSW Group Therapy  09/21/2012 3:45 PM  Type of Therapy:  Group Therapy  Participation Level:  Minimal  Participation Quality:  Resistant  Affect:  Depressed and Resistant  Cognitive:  Lacking  Insight:  Lacking  Engagement in Therapy:  Lacking  Modes of Intervention:  Discussion, Education, Exploration, Socialization and Support  Summary of Progress/Problems: Today's Topic: Overcoming Obstacles. Pt identified obstacles faced currently and processed barriers involved in overcoming these obstacles. Pt identified steps necessary for overcoming these obstacles and explored motivation (internal and external) for facing these difficulties head on. Jonathon Ray was inattentive and disengaged throughout the day's group. He presented with depressed mood and flat affect. Pt was asked to share any obstacles that he anticipates facing after d/c. Jonathon Ray stated that he was nervous about placement and having time between d/c from [facility] to admission into [facility]. "I have high motivation but my willpower is low." Jonathon Ray was able to name his brother as a positive support and a person that was working hard with CSW to figure out the best options for him while he was not clear headed enough to do this himself.    Smart, [name] 09/21/2012, 3:45 PM

## 2012-09-21 NOTE — Progress Notes (Signed)
Patient did attend the evening speaker AA meeting.  

## 2012-09-21 NOTE — Clinical Social Work Note (Signed)
CSW called pt's brother (pt request) to discuss aftercare options. CSW informed pt's brother that pt had asked for [facility] and [facility] consent (med Insurance account manager at [facility]). Pt's brother expressed interest in [facility] treatment center and was willing to pay out of pocket if necessary for pt to be admitted into this facility (plan C). Pt's brother asked that CSW call him tomorrow in order to discuss any updates.

## 2012-09-21 NOTE — [facility] Suicide Risk Assessment (Signed)
[facility] INPATIENT: Family/Significant Other Suicide Prevention Education  Suicide Prevention Education:  Education Completed; No one has been identified by the patient as the family member/significant other with whom the patient will be residing, and identified as the person(s) who will aid the patient in the event of a mental health crisis (suicidal ideations/suicide attempt).   Pt did not c/o SI at admission, nor have they endorsed SI during their stay here. SPE not required. SPI pamphlet provided to pt and he was encouraged to share with his support network.  [employer], LCSWA 09/21/2012 12:46 PM

## 2012-09-21 NOTE — Progress Notes (Signed)
D: Patient in the dayroom on approach.  Patient states he had a good day.  Patient states he is learning discipline.  Patient states he wants to stop drinking.  Patient states he used to be opposed to medications but he states he feels the medications are working so he is going to stick with it.  Patient denies SI/HI and denies AVH. A: Staff to monitor Q 15 mins for safety.  Encouragement and support offered.  Scheduled medications administered per orders. R: Patient remains safe on the unit.  Patient attended group tonight.  Patient visible on the unit and interacting with peers.  Patient taking administered medications.

## 2012-09-21 NOTE — Progress Notes (Addendum)
[facility] MD Progress Note  09/21/2012 3:57 PM Jonathon Ray  MRN:  [id number] Subjective:  States that he got very overwhelmed. Laid off work. Trying to get his life together. He is in [employer]. He works around alcohol. He seems to have had a seizure when he stop drinking He is now aware of the increased liver enzymes, and MCV most probably secondary to his alcohol use. States he needs to get his drinking under control as he has his kids. Admits to underlying depression Diagnosis:   DSM5: Schizophrenia Disorders:   Obsessive-Compulsive Disorders:   Trauma-Stressor Disorders:   Substance/Addictive Disorders:  Alcohol Related Disorder - Severe (303.90) Depressive Disorders:  Major Depressive Disorder - Moderate (296.22)  Axis I: Substance Induced Mood Disorder  ADL's:  Intact  Sleep: Poor  Appetite:  Fair  Suicidal Ideation:  Plan:  denies Intent:  denies Means:  denies Homicidal Ideation:  Plan:  denies Intent:  denies Means:  denies AEB (as evidenced by):  Psychiatric Specialty Exam: Review of Systems  Constitutional: Negative.   HENT: Negative.   Eyes: Negative.   Respiratory: Negative.   Cardiovascular: Negative.   Gastrointestinal: Negative.   Genitourinary: Negative.   Musculoskeletal: Negative.   Skin: Negative.   Neurological: Negative.   Endo/Heme/Allergies: Negative.   Psychiatric/Behavioral: Positive for depression and substance abuse. The patient is nervous/anxious.     Blood pressure 119/87, pulse 101, temperature 97.1 F (36.2 C), temperature source Oral, resp. rate 24, height 5\' 8"  (1.727 m), weight 87.998 kg (194 lb).Body mass index is 29.5 kg/(m^2).  General Appearance: Fairly Groomed  Patent attorney::  Fair  Speech:  Clear and Coherent, Slow and not spontaneous  Volume:  Decreased  Mood:  Depressed and worried  Affect:  Restricted  Thought Process:  Coherent and Goal Directed  Orientation:  Full (Time, Place, and Person)  Thought Content:   worries, concerns  Suicidal Thoughts:  No  Homicidal Thoughts:  No  Memory:  Immediate;   Fair Recent;   Fair Remote;   Fair  Judgement:  Fair  Insight:  Present  Psychomotor Activity:  Restlessness  Concentration:  Fair  Recall:  Fair  Akathisia:  No  Handed:    AIMS (if indicated):     Assets:  Desire for Improvement Housing Social Support  Sleep:  Number of Hours: 4.75   Current Medications: Current Facility-Administered Medications  Medication Dose Route Frequency Provider Last Rate Last Dose  . acetaminophen (TYLENOL) tablet 650 mg  650 mg Oral Q6H PRN Earney Navy, NP   650 mg at 09/19/12 1444  . alum & mag hydroxide-simeth (MAALOX/MYLANTA) 200-200-20 MG/5ML suspension 30 mL  30 mL Oral Q4H PRN Earney Navy, NP      . chlordiazePOXIDE (LIBRIUM) capsule 25 mg  25 mg Oral Q6H PRN Larena Sox, MD   25 mg at 09/20/12 0254  . chlordiazePOXIDE (LIBRIUM) capsule 25 mg  25 mg Oral TID Larena Sox, MD   25 mg at 09/21/12 1145   Followed by  . [START ON 09/22/2012] chlordiazePOXIDE (LIBRIUM) capsule 25 mg  25 mg Oral BH-qamhs Larena Sox, MD       Followed by  . [START ON 09/23/2012] chlordiazePOXIDE (LIBRIUM) capsule 25 mg  25 mg Oral Daily Larena Sox, MD      . folic acid (FOLVITE) tablet 1 mg  1 mg Oral Daily Earney Navy, NP   1 mg at 09/21/12 0808  . hydrOXYzine (ATARAX/VISTARIL) tablet 25 mg  25 mg Oral Q6H PRN Larena Sox, MD      . loperamide (IMODIUM) capsule 2-4 mg  2-4 mg Oral PRN Larena Sox, MD      . magnesium hydroxide (MILK OF MAGNESIA) suspension 30 mL  30 mL Oral Daily PRN Earney Navy, NP      . multivitamin with minerals tablet 1 tablet  1 tablet Oral Daily Larena Sox, MD   1 tablet at 09/21/12 0808  . nicotine (NICODERM CQ - dosed in mg/24 hours) patch 21 mg  21 mg Transdermal Daily Earney Navy, NP   21 mg at 09/21/12 1144  . ondansetron (ZOFRAN) tablet 4 mg  4 mg Oral Q8H PRN Earney Navy, NP       . ondansetron (ZOFRAN-ODT) disintegrating tablet 4 mg  4 mg Oral Q6H PRN Larena Sox, MD      . QUEtiapine (SEROQUEL) tablet 50 mg  50 mg Oral QHS Rachael Fee, MD      . sertraline (ZOLOFT) tablet 25 mg  25 mg Oral Daily Larena Sox, MD   25 mg at 09/21/12 4098  . thiamine (VITAMIN B-1) tablet 100 mg  100 mg Oral Daily Larena Sox, MD   100 mg at 09/21/12 0808  . traZODone (DESYREL) tablet 50 mg  50 mg Oral QHS,MR X 1 Jorje Guild, PA-C   50 mg at 09/21/12 0114    Lab Results: No results found for this or any previous visit (from the past 48 hour(s)).  Physical Findings: AIMS:  , ,  ,  ,    CIWA:  CIWA-Ar Total: 1 COWS:     Treatment Plan Summary: Daily contact with patient to assess and evaluate symptoms and progress in treatment Medication management  Plan: Supportive approach/coping skills/relapse prevention           Complete the Detox           Seroquel 50 mg HS (did not sleep with the Trazodone or Ambien that he was given in the ED)           Folic, B12 level Medical Decision Making Problem Points:  Review of psycho-social stressors (1) Data Points:  Review of medication regiment & side effects (2) Review of new medications or change in dosage (2)  I certify that inpatient services furnished can reasonably be expected to improve the patient's condition.   Micharl Helmes A 09/21/2012, 3:57 PM

## 2012-09-22 LAB — LIPID PANEL
HDL: 55 mg/dL (ref >39)
Total CHOL/HDL Ratio: 2.4 RATIO
Triglycerides: 74 mg/dL (ref <150)

## 2012-09-22 LAB — VITAMIN B12: Vitamin B-12: 313 pg/mL (ref 211–911)

## 2012-09-22 MED ORDER — SERTRALINE HCL 50 MG PO TABS
50.0000 mg | ORAL_TABLET | Freq: Every day | ORAL | Status: DC
Start: 2012-09-23 — End: 2012-09-24
  Administered 2012-09-23: 50 mg via ORAL
  Filled 2012-09-22: qty 1
  Filled 2012-09-22: qty 14
  Filled 2012-09-22 (×3): qty 1

## 2012-09-22 MED ORDER — QUETIAPINE FUMARATE 50 MG PO TABS
75.0000 mg | ORAL_TABLET | Freq: Every day | ORAL | Status: DC
  Administered 2012-09-22 – 2012-09-23 (×2): 75 mg via ORAL
  Filled 2012-09-22 (×3): qty 1

## 2012-09-22 MED ORDER — TRAZODONE HCL 100 MG PO TABS
100.0000 mg | ORAL_TABLET | Freq: Every evening | ORAL | Status: DC | PRN
  Administered 2012-09-22 – 2012-09-23 (×3): 100 mg via ORAL
  Filled 2012-09-22: qty 28
  Filled 2012-09-22 (×2): qty 1
  Filled 2012-09-22: qty 28
  Filled 2012-09-22 (×4): qty 1

## 2012-09-22 NOTE — Progress Notes (Signed)
D: Patient in the  Dayroom on approach.  Patient states he had a good day.  Patient states his son visited him today.  Patient he is learning the steps to recovery and what he has to do to stay clean.  Patient denies SI/HI and denies AVH. A: Staff to monitor Q 15 mins for safety.  Encouragement and support offered.  Scheduled medications administered per orders. R: Patient remains safe on the unit.  Patient taking administered medications.  Patient visible on the unit and interacting with peers.

## 2012-09-22 NOTE — Progress Notes (Signed)
Recreation Therapy Notes  Date: 09.15.2014 Time: 3:00pm Location: 500 Hall Dayroom  Group Topic: Coping Skills  Goal Area(s) Addresses:  Patient will verbalize importance of recognizing emotions. Patient will identify at least one emotion. Patient will successfully represent varying emotions in pictures or words.   Behavioral Response: Appropriate, Engaged, Attentive  Intervention: Art  Activity: Emotion Wheel. As a group patients identified 8 emotions they associate with recovery. Using the provided worksheet patients were asked to represent emotions identified by group in pictures of words.    Education: Emotional Recognition, Emotional Regulation, Coping Skills  Education Outcome: Acknowledges Understanding & In group clarification offered.    Clinical Observations/Feedback: Patient actively participated in group session, identifying and defining emotions with group members. Emotions ranged from anxious to happy and frustrated, as well as grace and humility. Patient used pictures and words to represent each emotion. Patient identified exercise as a coping mechanism he can use when he experiences stress, anxiety, frustration. Patient verbalized understanding of importance of recognizing emotions prior to substance abuse.  Marykay Lex Nakima Fluegge, LRT/CTRS  Divonte Senger L 09/22/2012 5:07 PM

## 2012-09-22 NOTE — Consult Note (Signed)
Reviewed note and agreed with

## 2012-09-22 NOTE — Progress Notes (Signed)
D:  Patient up and active in the milieu today.  Has been attending and participating in groups.  He denies depression, anxiety, hopelessness, or thoughts of suicide.   A:  Medications given as prescribed.  Encouraged patient to attend all groups throughout the day.  Offered support and encouragement.   R:  Cooperative with staff.  Interacting well with peers.  No withdrawal symptoms noted at this time.

## 2012-09-22 NOTE — [facility] Group Notes (Signed)
[facility] LCSW Group Therapy  09/22/2012 3:34 PM  Type of Therapy:  Group Therapy  Participation Level:  Active  Participation Quality:  Attentive  Affect:  Appropriate  Cognitive:  Alert  Insight:  Engaged  Engagement in Therapy:  Engaged  Modes of Intervention:  Discussion, Education and Support  Summary of Progress/Problems: MHA Speaker came to talk about his personal journey with substance abuse and addiction. The pt processed ways by which to relate to the speaker. MHA speaker provided handouts and educational information pertaining to groups and services offered by the [facility]. Jonathon Ray was attentive and engaged throughout today's group. He listened attentively as speaker talked about his personal experience with MI and SA. Jonathon Ray thanked the speaker for sharing his story and spending time with the group today.    Smart, Jonathon Ray 09/22/2012, 3:34 PM

## 2012-09-22 NOTE — Progress Notes (Signed)
Recreation Therapy Notes  Date: 09.16.2014 Time: 2:30pm Location: 300 Hall Dayroom  Group Topic: Animal Assisted Activities  Behavioral Response: Engaged, Appropriate  Affect: Euthymic  Clinical Observations/Feedback: Dog Team: [employer] Patient interacted appropriately with peer, dog team, LRT and MHT. Patient asked appropriate questions about [employer].   Marykay Lex Raimundo Corbit, LRT/CTRS  Jaydian Santana L 09/22/2012 4:40 PM

## 2012-09-22 NOTE — Progress Notes (Signed)
Adult Psychoeducational Group Note  Date:  09/22/2012 Time:  1:25 PM  Group Topic/Focus:  Recovery Goals:   The focus of this group is to identify appropriate goals for recovery and establish a plan to achieve them.  Participation Level:  Active  Participation Quality:  Appropriate and Attentive  Affect:  Flat  Cognitive:  Alert and Appropriate  Insight: Good  Engagement in Group:  Engaged  Modes of Intervention:  Discussion, Socialization and Support  Additional Comments:  Pt came to group and shared that not being supportive in his relationship with his partner and lack of exercise were standing between him and recovery. Pt plans on changing this by working on his relationship positively and exercising more.   Cathlean Cower 09/22/2012, 1:25 PM

## 2012-09-22 NOTE — Progress Notes (Signed)
[facility] MD Progress Note  09/22/2012 5:41 PM Jonathon Ray  MRN:  [id number] Subjective:  Jonathon Ray endorses that he is more hopeful. Detox is going uneventfully. He will have a bed at [facility] Thursday morning. He is grateful that he is going to be able to go to rehab. States he is dealing with a lot of shame and guilt for what he made his family go trough. States he is ready to let go of the alcohol Diagnosis:   DSM5: Schizophrenia Disorders:   Obsessive-Compulsive Disorders:   Trauma-Stressor Disorders:   Substance/Addictive Disorders:  Alcohol Related Disorder - Severe (303.90) Depressive Disorders:  Major Depressive Disorder - Moderate (296.22)  Axis I: Substance Induced Mood Disorder  ADL's:  Intact  Sleep: Fair  Appetite:  Fair  Suicidal Ideation:  Plan:  denies Intent:  denies Means:  denies Homicidal Ideation:  Plan:  denies Intent:  denies Means:  denies AEB (as evidenced by):  Psychiatric Specialty Exam: Review of Systems  Constitutional: Negative.   HENT: Negative.   Eyes: Negative.   Respiratory: Negative.   Cardiovascular: Negative.   Gastrointestinal: Negative.   Genitourinary: Negative.   Musculoskeletal: Negative.   Skin: Negative.   Neurological: Negative.   Endo/Heme/Allergies: Negative.   Psychiatric/Behavioral: Positive for substance abuse. The patient is nervous/anxious.     Blood pressure 126/85, pulse 109, temperature 97.7 F (36.5 C), temperature source Oral, resp. rate 18, height 5\' 8"  (1.727 m), weight 87.998 kg (194 lb).Body mass index is 29.5 kg/(m^2).  General Appearance: Fairly Groomed  Patent attorney::  Fair  Speech:  Clear and Coherent  Volume:  Normal  Mood:  sad, worried, but hopefull  Affect:  Restricted  Thought Process:  Coherent and Goal Directed  Orientation:  Full (Time, Place, and Person)  Thought Content:  worreis, concerns  Suicidal Thoughts:  No  Homicidal Thoughts:  No  Memory:  Immediate;   Fair Recent;   Fair Remote;    Fair  Judgement:  Fair  Insight:  Present  Psychomotor Activity:  Restlessness  Concentration:  Fair  Recall:  Fair  Akathisia:  No  Handed:    AIMS (if indicated):     Assets:  Desire for Improvement Social Support  Sleep:  Number of Hours: 6.5   Current Medications: Current Facility-Administered Medications  Medication Dose Route Frequency Provider Last Rate Last Dose  . acetaminophen (TYLENOL) tablet 650 mg  650 mg Oral Q6H PRN Earney Navy, NP   650 mg at 09/19/12 1444  . alum & mag hydroxide-simeth (MAALOX/MYLANTA) 200-200-20 MG/5ML suspension 30 mL  30 mL Oral Q4H PRN Earney Navy, NP      . chlordiazePOXIDE (LIBRIUM) capsule 25 mg  25 mg Oral BH-qamhs Larena Sox, MD   25 mg at 09/22/12 0849   Followed by  . [START ON 09/23/2012] chlordiazePOXIDE (LIBRIUM) capsule 25 mg  25 mg Oral Daily Larena Sox, MD      . folic acid (FOLVITE) tablet 1 mg  1 mg Oral Daily Earney Navy, NP   1 mg at 09/22/12 0845  . magnesium hydroxide (MILK OF MAGNESIA) suspension 30 mL  30 mL Oral Daily PRN Earney Navy, NP      . multivitamin with minerals tablet 1 tablet  1 tablet Oral Daily Larena Sox, MD   1 tablet at 09/22/12 0845  . nicotine (NICODERM CQ - dosed in mg/24 hours) patch 21 mg  21 mg Transdermal Daily Earney Navy, NP   21  mg at 09/22/12 1424  . ondansetron (ZOFRAN) tablet 4 mg  4 mg Oral Q8H PRN Earney Navy, NP      . QUEtiapine (SEROQUEL) tablet 75 mg  75 mg Oral QHS Sanjuana Kava, NP      . sertraline (ZOLOFT) tablet 25 mg  25 mg Oral Daily Larena Sox, MD   25 mg at 09/22/12 0845  . thiamine (VITAMIN B-1) tablet 100 mg  100 mg Oral Daily Larena Sox, MD   100 mg at 09/22/12 0845  . traZODone (DESYREL) tablet 100 mg  100 mg Oral QHS,MR X 1 Sanjuana Kava, NP        Lab Results:  Results for orders placed during the hospital encounter of 09/18/12 (from the past 48 hour(s))  LIPID PANEL     Status: None   Collection Time     09/22/12  6:13 AM      Result Value Range   Cholesterol 131  0 - 200 mg/dL   Triglycerides 74  <161 mg/dL   HDL 55  >09 mg/dL   Total CHOL/HDL Ratio 2.4     VLDL 15  0 - 40 mg/dL   LDL Cholesterol 61  0 - 99 mg/dL   Comment:            Total Cholesterol/HDL:CHD Risk     Coronary Heart Disease Risk Table                         Men   Women      1/2 Average Risk   3.4   3.3      Average Risk       5.0   4.4      2 X Average Risk   9.6   7.1      3 X Average Risk  23.4   11.0                Use the calculated Patient Ratio     above and the CHD Risk Table     to determine the patient's CHD Risk.                ATP III CLASSIFICATION (LDL):      <100     mg/dL   Optimal      604-540  mg/dL   Near or Above                        Optimal      130-159  mg/dL   Borderline      981-191  mg/dL   High      >478     mg/dL   Very High     Performed at [facility]    Physical Findings: AIMS: Facial and Oral Movements Muscles of Facial Expression: None, normal Lips and Perioral Area: None, normal Jaw: None, normal Tongue: None, normal,Extremity Movements Upper (arms, wrists, hands, fingers): None, normal Lower (legs, knees, ankles, toes): None, normal, Trunk Movements Neck, shoulders, hips: None, normal, Overall Severity Severity of abnormal movements (highest score from questions above): None, normal Incapacitation due to abnormal movements: None, normal Patient's awareness of abnormal movements (rate only patient's report): No Awareness, Dental Status Current problems with teeth and/or dentures?: No Does patient usually wear dentures?: No  CIWA:  CIWA-Ar Total: 0 COWS:     Treatment Plan Summary: Daily contact with patient to assess  and evaluate symptoms and progress in treatment Medication management  Plan: Supportive approach/coping skills/relapse prevention           Complete the detox           Increase the Zoloft to 50 mg daily  Medical Decision  Making Problem Points:  Review of psycho-social stressors (1) Data Points:  Review of medication regiment & side effects (2)  I certify that inpatient services furnished can reasonably be expected to improve the patient's condition.   Kirin Brandenburger A 09/22/2012, 5:41 PM

## 2012-09-22 NOTE — Progress Notes (Signed)
The focus of this group is to educate the patient on the purpose and policies of crisis stabilization and provide a format to answer questions about their admission.  The group details unit policies and expectations of patients while admitted.  Patient attended and actively participated in the group discussion.  He was interactive and respectful of others in the group.

## 2012-09-23 MED ORDER — BENAZEPRIL HCL 10 MG PO TABS
10.0000 mg | ORAL_TABLET | Freq: Two times a day (BID) | ORAL | Status: DC
  Filled 2012-09-23 (×2): qty 1
  Filled 2012-09-23 (×2): qty 28
  Filled 2012-09-23: qty 1

## 2012-09-23 MED ORDER — QUETIAPINE FUMARATE 25 MG PO TABS: 75.0000 mg | ORAL_TABLET | Freq: Every day | ORAL | Status: DC

## 2012-09-23 MED ORDER — TRAZODONE HCL 100 MG PO TABS: 100.0000 mg | ORAL_TABLET | Freq: Every evening | ORAL | Status: DC | PRN

## 2012-09-23 MED ORDER — BENAZEPRIL HCL 10 MG PO TABS: 10.0000 mg | ORAL_TABLET | Freq: Two times a day (BID) | ORAL | Status: DC

## 2012-09-23 MED ORDER — SERTRALINE HCL 50 MG PO TABS: 50.0000 mg | ORAL_TABLET | Freq: Every day | ORAL | Status: DC

## 2012-09-23 MED ORDER — BENAZEPRIL HCL 20 MG PO TABS
20.0000 mg | ORAL_TABLET | Freq: Once | ORAL | Status: AC
  Administered 2012-09-23: 20 mg via ORAL
  Filled 2012-09-23: qty 2
  Filled 2012-09-23: qty 1

## 2012-09-23 MED ORDER — QUETIAPINE FUMARATE 25 MG PO TABS
75.0000 mg | ORAL_TABLET | Freq: Every day | ORAL | Status: DC
  Filled 2012-09-23: qty 42

## 2012-09-23 NOTE — Progress Notes (Signed)
[facility] Adult Case Management Discharge Plan :  Will you be returning to the same living situation after discharge: No. pt going to [facility].  At discharge, do you have transportation home?:Yes,  friend Do you have the ability to pay for your medications:Yes,  mental health  Release of information consent forms completed and in the chart;  Patient's signature needed at discharge.  Patient to Follow up at: Follow-up Information   Follow up with [facility]. (Walk in Monday through Friday between 8AM-9AM for hospital followup/medication managment. )    Contact information:   [address], [place] [address] Phone: [phone] Fax: [phone]      Follow up with [facility] On 09/24/2012. (Arrive by 8AM with ID, 30 day medication supply or prescription, and clothing. )    Contact information:   [address] [place], [place] [address] Phone: [phone] Fax: [phone]      Patient denies SI/HI:   Yes,  during admission, group, and self report.    Safety Planning and Suicide Prevention discussed:  Yes,  SPE not required for this pt. SPI pamphlet provided and pt encouraged to share with his support network.   Pt will be d/cing at 6:30AM on Thursday 9/18 in order to go to [facility].  Smart, Guila Owensby 09/23/2012, 10:22 AM

## 2012-09-23 NOTE — Discharge Summary (Signed)
Physician Discharge Summary Note  Patient:  Jonathon Ray is an 42 y.o., male MRN:  [id number] DOB:  12/28/1970 Patient phone:  [phone] (home)  Patient address:   [address] [place] [address],   Date of Admission:  09/18/2012 Date of Discharge: 09/23/12  Reason for Admission:  Alcohol detox  Discharge Diagnoses: Active Problems:   Alcohol dependence   Depressive disorder, not elsewhere classified  Review of Systems  Constitutional: Negative.   HENT: Negative.   Eyes: Negative.   Respiratory: Negative.   Cardiovascular: Negative.   Gastrointestinal: Negative.   Genitourinary: Negative.   Musculoskeletal: Negative.   Skin: Negative.   Neurological: Negative.   Endo/Heme/Allergies: Negative.   Psychiatric/Behavioral: Positive for depression (Stabilized with medication prior to discharge) and substance abuse (Alcoholism). Negative for suicidal ideas, hallucinations and memory loss. The patient is nervous/anxious (Stabilized with medication prior to discharge) and has insomnia (Stabilized with medication prior to discharge).     DSM5: Schizophrenia Disorders:  NA Obsessive-Compulsive Disorders:  NA Trauma-Stressor Disorders:  NA Substance/Addictive Disorders:  Alcohol dependence Depressive Disorders:  Major Depressive Disorder - Moderate (296.22)  Axis Diagnosis:   AXIS I:  Alcohol dependence, Major depressive disorder, moderate AXIS II:  Deferred AXIS III:   Past Medical History  Diagnosis Date  . Alcohol abuse   . Anxiety   . Depression    AXIS IV:  economic problems, occupational problems, other psychosocial or environmental problems and Alcoholism AXIS V:  63  Level of Care:  RTC  Hospital Course: "Jonathon Ray is a 42 y.o. male with a hx of alcohol abuse, anxiety and depression presents to the Emergency Department requesting detox from alcohol abuse. Patient states he drinks 3-5 glasses of vodka every day for the last 4-5 years. 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 He reports that he attempted to stop drinking on his own last week and on Friday and he described what look as a seizure. Patient reports he had been 48 hours without alcohol at that time. He states his jaw got tight and he bit his tongue. He had no loss of bowel or bladder. Patient was not transported for evaluation at that time. He reports no history of seizures. Patient reports he has not seen a primary care doctor in many years and has no known medical problems; however, he reports depressed mood for most of life and anxiety especially while withdrawing from alcohol. He denies associated drug use with his alcohol intake. He also denies suicidal ideation, homicidal ideation, audio and visual hallucinations.  Upon admission into this hospital, and after admission assessment/evaluation, it was determined that patient will need detoxification treatment protocol to stabilize his systems of alcohol intoxication and to combat the withdrawal symptoms of as well. And his discharge plans included a referral to a long term treatment facility for more intense substance abuse treatment. Jonathon Ray was then started on Librium detoxification treatment protocol for his alcohol detoxification. He was also enrolled in group counseling sessions and activities where he was counseled and learned coping skills that should help him after discharge to cope better, manage his substance abuse problems to maintain a much longer sobriety. He also was enrolled and attended AA/NA meetings being offered and held on this unit. He has some previous and or identifiable medical conditions that required treatment and or monitoring. He received medication management for all those health issues as well. He was monitored closely for  any potential problems that may arise as a result of and or during detoxification treatment. Patient tolerated his treatment regimen  and detoxification treatment without any significant adverse effects and or reactions reported.  Besides the detoxification treatments, Jonathon Ray was ordered and received; Seroquel 75 mg Q bedtime for mood control/insomnia, Sertraline 50 mg daily for depression and Trazodone 100 mg Q bedtime for sleep. Patient attended treatment team meeting this am and met with the team. His reason for admission, present symptoms, substance abuse issues, response to treatment and discharge plans discussed. Patient endorsed that he is doing well and stable for discharge to pursue the next phase of his substance abuse treatment. It was then decided that he will continue substance abuse treatment at the [facility] in [place], [place] on 09/24/12 at 08;00 am. For medication management and routine psychiatric care, he will follow-up at the [facility] here in [place], [place] between the hours of 08-09:00 am, Monday thru Friday. Mr. Hoffmann has been instructed that this is a walk-in appointment. The addresses, dates, times and contact information for all these appointments provided for patient in writing.  Upon discharge, patient adamantly denies suicidal, homicidal ideations, auditory, visual hallucinations, delusional thinking and or withdrawal symptoms. Patient left [facility] with all personal belongings in no apparent distress. He received 2 weeks worth samples of his discharge medications. Transportation per friend.  Consults:  psychiatry  Significant Diagnostic Studies:  labs: CBC with diff, CMP, UDS, Toxicology tests, U/A  Discharge Vitals:   Blood pressure 113/77, pulse 103, temperature 97.5 F (36.4 C), temperature source Oral, resp. rate 20, height 5\' 8"  (1.727 m), weight 87.998 kg (194 lb). Body mass index is 29.5 kg/(m^2). Lab Results:   Results for orders placed during the hospital encounter of 09/18/12 (from the past 72 hour(s))  FOLATE     Status: None   Collection Time    09/22/12  6:13 AM       Result Value Range   Folate 9.3     Comment: (NOTE)     Reference Ranges            Deficient:       0.4 - 3.3 ng/mL            Indeterminate:   3.4 - 5.4 ng/mL            Normal:              > 5.4 ng/mL     Performed at [facility]  VITAMIN B12     Status: None   Collection Time    09/22/12  6:13 AM      Result Value Range   Vitamin B-12 313  211 - 911 pg/mL   Comment: Performed at [facility]  LIPID PANEL     Status: None   Collection Time    09/22/12  6:13 AM      Result Value Range   Cholesterol 131  0 - 200 mg/dL   Triglycerides 74  <454 mg/dL   HDL 55  >09 mg/dL   Total CHOL/HDL Ratio 2.4     VLDL 15  0 - 40 mg/dL   LDL Cholesterol 61  0 - 99 mg/dL   Comment:            Total Cholesterol/HDL:CHD Risk     Coronary Heart Disease Risk Table  Men   Women      1/2 Average Risk   3.4   3.3      Average Risk       5.0   4.4      2 X Average Risk   9.6   7.1      3 X Average Risk  23.4   11.0                Use the calculated Patient Ratio     above and the CHD Risk Table     to determine the patient's CHD Risk.                ATP III CLASSIFICATION (LDL):      <100     mg/dL   Optimal      161-096  mg/dL   Near or Above                        Optimal      130-159  mg/dL   Borderline      045-409  mg/dL   High      >811     mg/dL   Very High     Performed at [facility]    Physical Findings: AIMS: Facial and Oral Movements Muscles of Facial Expression: None, normal Lips and Perioral Area: None, normal Jaw: None, normal Tongue: None, normal,Extremity Movements Upper (arms, wrists, hands, fingers): None, normal Lower (legs, knees, ankles, toes): None, normal, Trunk Movements Neck, shoulders, hips: None, normal, Overall Severity Severity of abnormal movements (highest score from questions above): None, normal Incapacitation due to abnormal movements: None, normal Patient's awareness of abnormal movements (rate only  patient's report): No Awareness, Dental Status Current problems with teeth and/or dentures?: No Does patient usually wear dentures?: No  CIWA:  CIWA-Ar Total: 0 COWS:     Psychiatric Specialty Exam: See Psychiatric Specialty Exam and Suicide Risk Assessment completed by Attending Physician prior to discharge.  Discharge destination: [facility] 09/24/12  Is patient on multiple antipsychotic therapies at discharge:  No   Has Patient had three or more failed trials of antipsychotic monotherapy by history:  No  Recommended Plan for Multiple Antipsychotic Therapies: NA     Medication List    STOP taking these medications       diphenhydramine-acetaminophen 25-500 MG Tabs  Commonly known as:  TYLENOL PM      TAKE these medications     Indication   benazepril 10 MG tablet  Commonly known as:  LOTENSIN  Take 1 tablet (10 mg total) by mouth 2 (two) times daily. For hypertension   Indication:  High Blood Pressure     QUEtiapine 25 MG tablet  Commonly known as:  SEROQUEL  Take 3 tablets (75 mg total) by mouth at bedtime. For mood control   Indication:  Trouble Sleeping, Mood control     sertraline 50 MG tablet  Commonly known as:  ZOLOFT  Take 1 tablet (50 mg total) by mouth daily. For depression   Indication:  Major Depressive Disorder     traZODone 100 MG tablet  Commonly known as:  DESYREL  Take 1 tablet (100 mg total) by mouth at bedtime and may repeat dose one time if needed. For sleep   Indication:  Trouble Sleeping       Follow-up Information   Follow up with [facility]. (Walk in Monday through Friday between 8AM-9AM for hospital followup/medication managment. )  Contact information:   [address], [place] [address] Phone: [phone] Fax: [phone]      Follow up with [facility] On 09/24/2012. (Arrive by 8AM with ID, 30 day medication supply or prescription, and clothing. )    Contact information:   [address] [place],  [place] [address] Phone: [phone] Fax: [phone]     Follow-up recommendations: Activity:  As tolerated Diet: As recommended by your primary care doctor. Keep all scheduled follow-up appointments as recommended.   Continue to work your relapse prevention plan Comments: Take all your medications as prescribed by your mental healthcare provider. Report any adverse effects and or reactions from your medicines to your outpatient provider promptly. Patient is instructed and cautioned to not engage in alcohol and or illegal drug use while on prescription medicines. In the event of worsening symptoms, patient is instructed to call the crisis hotline, 911 and or go to the nearest ED for appropriate evaluation and treatment of symptoms. Follow-up with your primary care provider for your other medical issues, concerns and or health care needs.   Total Discharge Time:  Greater than 30 minutes.  Signed: Sanjuana Kava, PMHNP-BC 09/24/2012, 4:45 PM Agree with assessment and plan Madie Reno A. Dub Mikes, M.D.

## 2012-09-23 NOTE — [facility] Suicide Risk Assessment (Signed)
Suicide Risk Assessment  Discharge Assessment     Demographic Factors:  Male and Caucasian  Mental Status Per Nursing Assessment::   On Admission:     Current Mental Status by Physician: In full contact with reality. There are no suicidal ideas plans or intent. There is no active withdrawal. His mood is euthymic, his affect is appropriate. Looking forward to [facility].   Loss Factors: NA  Historical Factors: NA  Risk Reduction Factors:   Responsible for children under 62 years of age, Sense of responsibility to family and Positive social support  Continued Clinical Symptoms:  Depression:   Comorbid alcohol abuse/dependence Alcohol/Substance Abuse/Dependencies  Cognitive Features That Contribute To Risk: None identified   Suicide Risk:  Minimal: No identifiable suicidal ideation.  Patients presenting with no risk factors but with morbid ruminations; may be classified as minimal risk based on the severity of the depressive symptoms  Discharge Diagnoses:   AXIS I:  Alcohol Dependence, Depressive Disorder NOS AXIS II:  Deferred AXIS III:   Past Medical History  Diagnosis Date  . Alcohol abuse   . Anxiety   . Depression    AXIS IV:  other psychosocial or environmental problems AXIS V:  61-70 mild symptoms  Plan Of Care/Follow-up recommendations:  Activity:  as tolerated Diet:  regular Follow up [facility] Is patient on multiple antipsychotic therapies at discharge:  No   Has Patient had three or more failed trials of antipsychotic monotherapy by history:  No  Recommended Plan for Multiple Antipsychotic Therapies: NA  Brittnei Jagiello A 09/23/2012, 4:16 PM

## 2012-09-23 NOTE — Progress Notes (Signed)
Adult Psychoeducational Group Note  Date:  09/23/2012 Time:  1:18 PM  Group Topic/Focus:  Managing Feelings:   The focus of this group is to identify what feelings patients have difficulty handling and develop a plan to handle them in a healthier way upon discharge.  Participation Level:  Active  Participation Quality:  Appropriate, Attentive and Sharing  Affect:  Appropriate  Cognitive:  Alert, Appropriate and Oriented  Insight: Appropriate and Good  Engagement in Group:  Engaged  Modes of Intervention:  Education, Exploration and Support  Additional Comments:  Pt was an active participant in group.  Reynolds Bowl 09/23/2012, 1:18 PM

## 2012-09-23 NOTE — Progress Notes (Signed)
[facility] MD Progress Note  09/23/2012 4:09 PM Jonathon Ray  MRN:  [id number] Subjective:  Will go to [facility] residential treatment in the morning. He states he is committed to make this work. He slept better last night after the increase in Seroquel. His mood has been getting better and he is more hopeful. He has the support of his family Diagnosis:   DSM5: Schizophrenia Disorders:   Obsessive-Compulsive Disorders:   Trauma-Stressor Disorders:   Substance/Addictive Disorders:  Alcohol Related Disorder - Severe (303.90) Depressive Disorders:  Major Depressive Disorder - Moderate (296.22)  Axis I: Substance Induced Mood Disorder  ADL's:  Intact  Sleep: Fair  Appetite:  Fair  Suicidal Ideation:  Plan:  denies Intent:  denies Means:  denies Homicidal Ideation:  Plan:  denies Intent:  denies Means:  denies AEB (as evidenced by):  Psychiatric Specialty Exam: Review of Systems  Constitutional: Negative.   HENT: Negative.   Eyes: Negative.   Respiratory: Negative.   Cardiovascular: Negative.   Gastrointestinal: Negative.   Genitourinary: Negative.   Musculoskeletal: Negative.   Skin: Negative.   Neurological: Negative.   Endo/Heme/Allergies: Negative.   Psychiatric/Behavioral: Positive for substance abuse. The patient is nervous/anxious.     Blood pressure 124/82, pulse 106, temperature 97.5 F (36.4 C), temperature source Oral, resp. rate 20, height 5\' 8"  (1.727 m), weight 87.998 kg (194 lb).Body mass index is 29.5 kg/(m^2).  General Appearance: Fairly Groomed  Patent attorney::  Fair  Speech:  Clear and Coherent  Volume:  Decreased  Mood:  Anxious and worried, but hopeful  Affect:  Appropriate  Thought Process:  Coherent and Goal Directed  Orientation:  Full (Time, Place, and Person)  Thought Content:  worries, concerns  Suicidal Thoughts:  No  Homicidal Thoughts:  No  Memory:  Immediate;   Fair Recent;   Fair Remote;   Fair  Judgement:  Fair  Insight:  Present   Psychomotor Activity:  Restlessness  Concentration:  Fair  Recall:  Fair  Akathisia:  No  Handed:    AIMS (if indicated):     Assets:  Desire for Improvement Social Support  Sleep:  Number of Hours: 5.25   Current Medications: Current Facility-Administered Medications  Medication Dose Route Frequency Provider Last Rate Last Dose  . acetaminophen (TYLENOL) tablet 650 mg  650 mg Oral Q6H PRN Earney Navy, NP   650 mg at 09/19/12 1444  . alum & mag hydroxide-simeth (MAALOX/MYLANTA) 200-200-20 MG/5ML suspension 30 mL  30 mL Oral Q4H PRN Earney Navy, NP      . Melene Muller ON 09/24/2012] benazepril (LOTENSIN) tablet 10 mg  10 mg Oral BID Sanjuana Kava, NP      . folic acid (FOLVITE) tablet 1 mg  1 mg Oral Daily Earney Navy, NP   1 mg at 09/23/12 0758  . magnesium hydroxide (MILK OF MAGNESIA) suspension 30 mL  30 mL Oral Daily PRN Earney Navy, NP      . multivitamin with minerals tablet 1 tablet  1 tablet Oral Daily Larena Sox, MD   1 tablet at 09/23/12 0758  . nicotine (NICODERM CQ - dosed in mg/24 hours) patch 21 mg  21 mg Transdermal Daily Earney Navy, NP   21 mg at 09/23/12 0800  . ondansetron (ZOFRAN) tablet 4 mg  4 mg Oral Q8H PRN Earney Navy, NP      . QUEtiapine (SEROQUEL) tablet 75 mg  75 mg Oral QHS Sanjuana Kava, NP  75 mg at 09/22/12 2137  . sertraline (ZOLOFT) tablet 50 mg  50 mg Oral Daily Rachael Fee, MD   50 mg at 09/23/12 0758  . thiamine (VITAMIN B-1) tablet 100 mg  100 mg Oral Daily Larena Sox, MD   100 mg at 09/23/12 0757  . traZODone (DESYREL) tablet 100 mg  100 mg Oral QHS,MR X 1 Sanjuana Kava, NP   100 mg at 09/22/12 2253    Lab Results:  Results for orders placed during the hospital encounter of 09/18/12 (from the past 48 hour(s))  FOLATE     Status: None   Collection Time    09/22/12  6:13 AM      Result Value Range   Folate 9.3     Comment: (NOTE)     Reference Ranges            Deficient:       0.4 - 3.3 ng/mL             Indeterminate:   3.4 - 5.4 ng/mL            Normal:              > 5.4 ng/mL     Performed at [facility]  VITAMIN B12     Status: None   Collection Time    09/22/12  6:13 AM      Result Value Range   Vitamin B-12 313  211 - 911 pg/mL   Comment: Performed at [facility]  LIPID PANEL     Status: None   Collection Time    09/22/12  6:13 AM      Result Value Range   Cholesterol 131  0 - 200 mg/dL   Triglycerides 74  <956 mg/dL   HDL 55  >21 mg/dL   Total CHOL/HDL Ratio 2.4     VLDL 15  0 - 40 mg/dL   LDL Cholesterol 61  0 - 99 mg/dL   Comment:            Total Cholesterol/HDL:CHD Risk     Coronary Heart Disease Risk Table                         Men   Women      1/2 Average Risk   3.4   3.3      Average Risk       5.0   4.4      2 X Average Risk   9.6   7.1      3 X Average Risk  23.4   11.0                Use the calculated Patient Ratio     above and the CHD Risk Table     to determine the patient's CHD Risk.                ATP III CLASSIFICATION (LDL):      <100     mg/dL   Optimal      308-657  mg/dL   Near or Above                        Optimal      130-159  mg/dL   Borderline      846-962  mg/dL   High      >952     mg/dL  Very High     Performed at [facility]    Physical Findings: AIMS: Facial and Oral Movements Muscles of Facial Expression: None, normal Lips and Perioral Area: None, normal Jaw: None, normal Tongue: None, normal,Extremity Movements Upper (arms, wrists, hands, fingers): None, normal Lower (legs, knees, ankles, toes): None, normal, Trunk Movements Neck, shoulders, hips: None, normal, Overall Severity Severity of abnormal movements (highest score from questions above): None, normal Incapacitation due to abnormal movements: None, normal Patient's awareness of abnormal movements (rate only patient's report): No Awareness, Dental Status Current problems with teeth and/or dentures?: No Does patient usually  wear dentures?: No  CIWA:  CIWA-Ar Total: 1 COWS:     Treatment Plan Summary: Daily contact with patient to assess and evaluate symptoms and progress in treatment Medication management  Plan: Supportive approach/coping skills/relapse prevention           Optimize treatment with psychotropics           Facilitate admission to [facility]  Medical Decision Making Problem Points:  Review of psycho-social stressors (1) Data Points:  Review of medication regiment & side effects (2)  I certify that inpatient services furnished can reasonably be expected to improve the patient's condition.   Detta Mellin A 09/23/2012, 4:09 PM

## 2012-09-23 NOTE — Progress Notes (Signed)
Adult Psychoeducational Group Note  Date:  09/23/2012 Time:  8:52 PM  Group Topic/Focus:  NA group  Participation Level:  Active  Participation Quality:  Appropriate  Affect:  Appropriate  Cognitive:  Alert  Insight: Appropriate  Engagement in Group:  Engaged  Modes of Intervention:  Discussion  Additional Comments:    Octavio Manns 09/23/2012, 8:52 PM

## 2012-09-23 NOTE — [facility] Group Notes (Signed)
[facility] LCSW Group Therapy  09/23/2012 2:55 PM  Type of Therapy:  Group Therapy  Participation Level:  Active  Participation Quality:  Attentive  Affect:  Appropriate  Cognitive:  Alert and Oriented  Insight:  Engaged  Engagement in Therapy:  Engaged  Modes of Intervention:  Confrontation, Discussion, Education, Exploration, Socialization and Support  Summary of Progress/Problems: Emotion Regulation: This group focused on both positive and negative emotion identification and allowed group members to process ways to identify feelings, regulate negative emotions, and find healthy ways to manage internal/external emotions. Group members were asked to reflect on a time when their reaction to an emotion led to a negative outcome and explored how alternative responses using emotion regulation would have benefited them. Group members were also asked to discuss a time when emotion regulation was utilized when a negative emotion was experienced. Jonathon Ray was attentive and engaged throughout today's group. He stated that he struggles with anxiety and identified this emotions as the emotions he has most difficulty in managing. Jonathon Ray shared his latest experience in dealing with anxiety in a negative way "I drink to numb and diminish the symptoms of anxiety. I realize now that it was a temporary fix." Jonathon Ray shows progress in the group setting AEB his ability to actively participate in group discussion. Jonathon Ray demonstrated improving insight AEB his ability to identify negative responses to negative emotions and identified ways to replace drinking with more positive coping skills "I plan to go to the gym. I have to completely change my routing, stop going to my normal hangouts, and start going to meetings everyday!"   Smart, [name] 09/23/2012, 2:55 PM

## 2012-09-23 NOTE — [facility] Group Notes (Signed)
[facility] LCSW Aftercare Discharge Planning Group Note   09/23/2012 9:10 AM  Participation Quality:  Appropriate   Mood/Affect:  Appropriate  Depression Rating:  2  Anxiety Rating:  3  Thoughts of Suicide:  No Will you contract for safety?   NA  Current AVH:  No  Plan for Discharge/Comments:  Pt reports that his friend will be taking him to [facility] tomorrow morning. Pt will need to leave by 6:30am in order to go home and get clothing for admission. Pt reports mild shakes today.   Transportation Means: friend   Supports: Banker, Research scientist (physical sciences)

## 2012-09-23 NOTE — Progress Notes (Signed)
D: Patient in the hallway on approach.  Patient states he had a good day today.  Patient states he is ready for discharge in the AM.  Patient had several questions about discharge and patient voiced his concern about leaving early because patient states he had a few errands to run before he has to present to [facility].  Patient denies SI/HI and denies AVH.  Patient denies SI/HI and denies AVH.   A: Staff to monitor Q 15 mins for safety.  Encouragement and support offered.  Scheduled medications administered per orders. R: Patient remains safe on the unit.  Patient attended group tonight.  Patient visible on the unit and interacting with peers.  Patient taking administered medications.

## 2012-09-23 NOTE — Progress Notes (Signed)
D: Patient appropriate and cooperative with staff and peers. Patient's affect/mood is anxious and slightly irritable at times. He reported on the self inventory sheet that he slept well last night, appetite is good, energy level is low and ability to pay attention is improving. Patient is interactive with peers in the milieu.  A: Support and encouragement provided to patient. Administered scheduled medications per ordering MD. Monitor Q15 minute checks for safety.  R: Patient receptive. Denies SI/HI/AVH. Patient remains safe on the unit.

## 2012-09-24 DIAGNOSIS — F321 Major depressive disorder, single episode, moderate: Secondary | ICD-10-CM

## 2012-09-24 DIAGNOSIS — F1020 Alcohol dependence, uncomplicated: Secondary | ICD-10-CM

## 2012-09-24 NOTE — Progress Notes (Signed)
Patient discharged at this time.  Patient denies SI/HI and denies AVH.  Patient to present to [facility] today at 8am.  Patient denies SI/HI and denies AVH.  Patient belongings retrieved from locker 48 as well as patient was given his 720 from the safe.  Patient got a ride from a friend who will take him to [facility].  Patient AVS recieved, samples given, and rx.

## 2012-09-28 NOTE — Progress Notes (Signed)
Patient Discharge Instructions:  After Visit Summary (AVS):   Faxed to:  09/28/12 Discharge Summary Note:   Faxed to:  09/28/12 Psychiatric Admission Assessment Note:   Faxed to:  09/28/12 Suicide Risk Assessment - Discharge Assessment:   Faxed to:  09/28/12 Faxed/Sent to the Next Level Care provider:  09/28/12 Faxed to [facility] @ [phone] Faxed to [facility] @ [phone]  Jerelene Redden, 09/28/2012, 3:34 PM

## 2012-10-22 ENCOUNTER — Emergency Department (HOSPITAL_BASED_OUTPATIENT_CLINIC_OR_DEPARTMENT_OTHER): Payer: Self-pay

## 2012-10-22 ENCOUNTER — Emergency Department (HOSPITAL_BASED_OUTPATIENT_CLINIC_OR_DEPARTMENT_OTHER)
Admission: EM | Admit: 2012-10-22 | Discharge: 2012-10-22 | Disposition: A | Discharge status: Home / Self Care | Payer: Self-pay | Attending: Emergency Medicine | Admitting: Emergency Medicine

## 2012-10-22 ENCOUNTER — Encounter (HOSPITAL_BASED_OUTPATIENT_CLINIC_OR_DEPARTMENT_OTHER): Payer: Self-pay | Admitting: Emergency Medicine

## 2012-10-22 DIAGNOSIS — F17200 Nicotine dependence, unspecified, uncomplicated: Secondary | ICD-10-CM | POA: Insufficient documentation

## 2012-10-22 DIAGNOSIS — Z79899 Other long term (current) drug therapy: Secondary | ICD-10-CM | POA: Insufficient documentation

## 2012-10-22 DIAGNOSIS — F329 Major depressive disorder, single episode, unspecified: Secondary | ICD-10-CM | POA: Insufficient documentation

## 2012-10-22 DIAGNOSIS — I10 Essential (primary) hypertension: Secondary | ICD-10-CM | POA: Insufficient documentation

## 2012-10-22 DIAGNOSIS — F1021 Alcohol dependence, in remission: Secondary | ICD-10-CM | POA: Insufficient documentation

## 2012-10-22 DIAGNOSIS — F411 Generalized anxiety disorder: Secondary | ICD-10-CM | POA: Insufficient documentation

## 2012-10-22 DIAGNOSIS — R062 Wheezing: Secondary | ICD-10-CM | POA: Insufficient documentation

## 2012-10-22 DIAGNOSIS — R072 Precordial pain: Secondary | ICD-10-CM | POA: Insufficient documentation

## 2012-10-22 DIAGNOSIS — R079 Chest pain, unspecified: Secondary | ICD-10-CM

## 2012-10-22 DIAGNOSIS — F3289 Other specified depressive episodes: Secondary | ICD-10-CM | POA: Insufficient documentation

## 2012-10-22 HISTORY — DX: Essential (primary) hypertension: I10

## 2012-10-22 LAB — CBC WITH DIFFERENTIAL/PLATELET
Basophils Absolute: 0 10*3/uL (ref 0.0–0.1)
Basophils Relative: 0 % (ref 0–1)
Eosinophils Absolute: 0.3 10*3/uL (ref 0.0–0.7)
Eosinophils Relative: 3 % (ref 0–5)
HCT: 44.9 % (ref 39.0–52.0)
MCH: 36.3 pg — ABNORMAL HIGH (ref 26.0–34.0)
MCHC: 34.5 g/dL (ref 30.0–36.0)
MCV: 105.2 fL — ABNORMAL HIGH (ref 78.0–100.0)
Monocytes Absolute: 1.2 10*3/uL — ABNORMAL HIGH (ref 0.1–1.0)
Platelets: 267 10*3/uL (ref 150–400)
RDW: 12.7 % (ref 11.5–15.5)

## 2012-10-22 LAB — BASIC METABOLIC PANEL
CO2: 29 mEq/L (ref 19–32)
Calcium: 9.9 mg/dL (ref 8.4–10.5)
Creatinine, Ser: 0.9 mg/dL (ref 0.50–1.35)
GFR calc Af Amer: >90 mL/min (ref >90)
GFR calc non Af Amer: >90 mL/min (ref >90)

## 2012-10-22 LAB — TROPONIN I: Troponin I: <0.3 ng/mL (ref <0.30)

## 2012-10-22 NOTE — ED Notes (Signed)
Pt reports that's early today he developed chest pain with shortness of breath. Pt states that the pain went away and came back.

## 2012-10-22 NOTE — ED Provider Notes (Signed)
CSN: [id number]     Arrival date & time 10/22/12  1816 History   First MD Initiated Contact with Patient 10/22/12 1830     This chart was scribed for Charles B. Bernette Mayers, MD by Manuela Schwartz, ED scribe. This patient was seen in room MH06/MH06 and the patient's care was started at 1816.  Chief Complaint  Patient presents with  . Chest Pain   The history is provided by the patient. No language interpreter was used.   HPI Comments: Jonathon Ray is a 42 y.o. male who presents to the Emergency Department complaining of sudden onset, moderate in severity, mid-sternal chest pain, onset 3 hours ago, lasting about 30 minutes, today while he was standing. He states came here from [facility] and currently in detox program for ETOH. He states his CP started while he was standing at rest and resolved 30 minutes later on its own, he states nothing made it worse or better. He did not take any medicines PTA. He states a similar episode of CP 5 years ago when he had a normal stress test. He states has been sober since 9/11 and was checked in at [facility] on 9/18. He states tomorrow is supposed to be his last day, but denies feeling stressed about this. He smokes about x1 ppd and over the last few days has had some wheezing with deep breaths.  Past Medical History  Diagnosis Date  . Alcohol abuse   . Anxiety   . Depression   . Hypertension    Past Surgical History  Procedure Laterality Date  . Finger surgery     History reviewed. No pertinent family history. History  Substance Use Topics  . Smoking status: Current Every Day Smoker -- 1.00 packs/day  . Smokeless tobacco: Never Used  . Alcohol Use: No     Comment: 3 drinks of vodka a day    Review of Systems  Constitutional: Negative for fever and chills.  Respiratory: Positive for wheezing. Negative for shortness of breath.   Cardiovascular: Positive for chest pain (midsternal).  Gastrointestinal: Negative for nausea and vomiting.  Musculoskeletal:  Negative for back pain.  Skin: Negative for color change.  Neurological: Negative for weakness.  All other systems reviewed and are negative.   A complete 10 system review of systems was obtained and all systems are negative except as noted in the HPI and PMH.   Allergies  Review of patient's allergies indicates no known allergies.  Home Medications   Current Outpatient Rx  Name  Route  Sig  Dispense  Refill  . benazepril (LOTENSIN) 10 MG tablet   Oral   Take 1 tablet (10 mg total) by mouth 2 (two) times daily. For hypertension   60 tablet   0   . QUEtiapine (SEROQUEL) 25 MG tablet   Oral   Take 3 tablets (75 mg total) by mouth at bedtime. For mood control   90 tablet   0   . sertraline (ZOLOFT) 50 MG tablet   Oral   Take 1 tablet (50 mg total) by mouth daily. For depression   30 tablet   0   . traZODone (DESYREL) 100 MG tablet   Oral   Take 1 tablet (100 mg total) by mouth at bedtime and may repeat dose one time if needed. For sleep   60 tablet   0    Triage Vitals: BP 139/86  Pulse 95  Temp(Src) 98.7 F (37.1 C) (Oral)  SpO2 100% Physical Exam  Nursing note  and vitals reviewed. Constitutional: He is oriented to person, place, and time. He appears well-developed and well-nourished.  HENT:  Head: Normocephalic and atraumatic.  Eyes: EOM are normal. Pupils are equal, round, and reactive to light.  Neck: Normal range of motion. Neck supple.  Cardiovascular: Normal rate, normal heart sounds and intact distal pulses.   Pulmonary/Chest: Effort normal and breath sounds normal.  Abdominal: Bowel sounds are normal. He exhibits no distension. There is no tenderness.  Musculoskeletal: Normal range of motion. He exhibits no edema and no tenderness.  Neurological: He is alert and oriented to person, place, and time. He has normal strength. No cranial nerve deficit or sensory deficit.  Skin: Skin is warm and dry. No rash noted.  Psychiatric: He has a normal mood and  affect.    ED Course  Procedures (including critical care time) DIAGNOSTIC STUDIES: Oxygen Saturation is 100% on room air, normal by my interpretation.    COORDINATION OF CARE: At 648 PM Discussed treatment plan with patient which includes CXR, EKG, blood work, cardiac enzymes, EKG. Patient agrees.   Labs Review Labs Reviewed  CBC WITH DIFFERENTIAL - Abnormal; Notable for the following:    MCV 105.2 (*)    MCH 36.3 (*)    Monocytes Relative 13 (*)    Monocytes Absolute 1.2 (*)    All other components within normal limits  BASIC METABOLIC PANEL - Abnormal; Notable for the following:    Glucose, Bld 106 (*)    All other components within normal limits  TROPONIN I  TROPONIN I   Imaging Review Dg Chest 2 View  10/22/2012   CLINICAL DATA:  Left chest pain and wheezing.  EXAM: CHEST  2 VIEW  COMPARISON:  None.  FINDINGS: The cardiomediastinal silhouette is unremarkable. Mild elevation of the right hemidiaphragm is noted.  There is no evidence of focal airspace disease, pulmonary edema, suspicious pulmonary nodule/mass, pleural effusion, or pneumothorax. No acute bony abnormalities are identified.  IMPRESSION: Mild elevation of the right hemidiaphragm without other significant abnormality.   Electronically Signed   By: Laveda Abbe M.D.   On: 10/22/2012 19:08    EKG Interpretation     Ventricular Rate:  98 PR Interval:  136 QRS Duration: 84 QT Interval:  346 QTC Calculation: 441 R Axis:   68 Text Interpretation:  Normal sinus rhythm Normal ECG No old tracing to compare            MDM   1. Chest pain     Atypical pain, short duration, no exertional component. Low risk for ACS has been pain free since arrival with two neg Trop. Return to [facility].   I personally performed the services described in this documentation, which was scribed in my presence. The recorded information has been reviewed and is accurate.        Charles B. Bernette Mayers, MD 10/22/12 2053

## 2012-10-22 NOTE — ED Notes (Signed)
MD at bedside. 

## 2013-09-03 ENCOUNTER — Emergency Department (HOSPITAL_COMMUNITY): Payer: Self-pay

## 2013-09-03 ENCOUNTER — Encounter (HOSPITAL_COMMUNITY): Payer: Self-pay | Admitting: Emergency Medicine

## 2013-09-03 ENCOUNTER — Emergency Department (HOSPITAL_COMMUNITY)
Admission: EM | Admit: 2013-09-03 | Discharge: 2013-09-03 | Disposition: A | Discharge status: Home / Self Care | Payer: Self-pay | Attending: Emergency Medicine | Admitting: Emergency Medicine

## 2013-09-03 DIAGNOSIS — R42 Dizziness and giddiness: Secondary | ICD-10-CM | POA: Insufficient documentation

## 2013-09-03 DIAGNOSIS — R112 Nausea with vomiting, unspecified: Secondary | ICD-10-CM | POA: Insufficient documentation

## 2013-09-03 DIAGNOSIS — R61 Generalized hyperhidrosis: Secondary | ICD-10-CM | POA: Insufficient documentation

## 2013-09-03 DIAGNOSIS — F411 Generalized anxiety disorder: Secondary | ICD-10-CM | POA: Insufficient documentation

## 2013-09-03 DIAGNOSIS — Z79899 Other long term (current) drug therapy: Secondary | ICD-10-CM | POA: Insufficient documentation

## 2013-09-03 DIAGNOSIS — I10 Essential (primary) hypertension: Secondary | ICD-10-CM | POA: Insufficient documentation

## 2013-09-03 DIAGNOSIS — R51 Headache: Secondary | ICD-10-CM | POA: Insufficient documentation

## 2013-09-03 DIAGNOSIS — R079 Chest pain, unspecified: Secondary | ICD-10-CM | POA: Insufficient documentation

## 2013-09-03 DIAGNOSIS — E860 Dehydration: Secondary | ICD-10-CM | POA: Insufficient documentation

## 2013-09-03 DIAGNOSIS — M545 Low back pain, unspecified: Secondary | ICD-10-CM | POA: Insufficient documentation

## 2013-09-03 DIAGNOSIS — R197 Diarrhea, unspecified: Secondary | ICD-10-CM | POA: Insufficient documentation

## 2013-09-03 DIAGNOSIS — F17200 Nicotine dependence, unspecified, uncomplicated: Secondary | ICD-10-CM | POA: Insufficient documentation

## 2013-09-03 DIAGNOSIS — R519 Headache, unspecified: Secondary | ICD-10-CM

## 2013-09-03 LAB — BASIC METABOLIC PANEL
Anion gap: 17 — ABNORMAL HIGH (ref 5–15)
BUN: 9 mg/dL (ref 6–23)
CO2: 21 mEq/L (ref 19–32)
CREATININE: 0.7 mg/dL (ref 0.50–1.35)
Calcium: 8.8 mg/dL (ref 8.4–10.5)
Chloride: 98 mEq/L (ref 96–112)
GFR calc non Af Amer: >90 mL/min (ref >90)
Glucose, Bld: 74 mg/dL (ref 70–99)
Potassium: 3.9 mEq/L (ref 3.7–5.3)
Sodium: 136 mEq/L — ABNORMAL LOW (ref 137–147)

## 2013-09-03 LAB — I-STAT TROPONIN, ED: Troponin i, poc: 0 ng/mL (ref 0.00–0.08)

## 2013-09-03 LAB — CBC
HEMATOCRIT: 46.1 % (ref 39.0–52.0)
Hemoglobin: 16.7 g/dL (ref 13.0–17.0)
MCH: 32.7 pg (ref 26.0–34.0)
MCHC: 36.2 g/dL — AB (ref 30.0–36.0)
MCV: 90.2 fL (ref 78.0–100.0)
Platelets: 252 10*3/uL (ref 150–400)
RBC: 5.11 MIL/uL (ref 4.22–5.81)
RDW: 13 % (ref 11.5–15.5)
WBC: 9.1 10*3/uL (ref 4.0–10.5)

## 2013-09-03 LAB — TROPONIN I

## 2013-09-03 MED ORDER — OXYCODONE-ACETAMINOPHEN 5-325 MG PO TABS: 1.0000 | ORAL_TABLET | ORAL | Status: DC | PRN

## 2013-09-03 MED ORDER — SODIUM CHLORIDE 0.9 % IV SOLN
1000.0000 mL | Freq: Once | INTRAVENOUS | Status: AC
  Administered 2013-09-03: 1000 mL via INTRAVENOUS

## 2013-09-03 MED ORDER — MORPHINE SULFATE 4 MG/ML IJ SOLN
4.0000 mg | Freq: Once | INTRAMUSCULAR | Status: AC
  Administered 2013-09-03: 4 mg via INTRAVENOUS
  Filled 2013-09-03: qty 1

## 2013-09-03 MED ORDER — KETOROLAC TROMETHAMINE 15 MG/ML IJ SOLN
15.0000 mg | Freq: Once | INTRAMUSCULAR | Status: AC
  Administered 2013-09-03: 15 mg via INTRAVENOUS
  Filled 2013-09-03: qty 1

## 2013-09-03 MED ORDER — DIPHENHYDRAMINE HCL 50 MG/ML IJ SOLN
25.0000 mg | Freq: Once | INTRAMUSCULAR | Status: AC
  Administered 2013-09-03: 25 mg via INTRAVENOUS
  Filled 2013-09-03: qty 1

## 2013-09-03 MED ORDER — METOCLOPRAMIDE HCL 5 MG/ML IJ SOLN
10.0000 mg | Freq: Once | INTRAMUSCULAR | Status: AC
  Administered 2013-09-03: 10 mg via INTRAVENOUS
  Filled 2013-09-03: qty 2

## 2013-09-03 MED ORDER — SODIUM CHLORIDE 0.9 % IV SOLN
1000.0000 mL | INTRAVENOUS | Status: DC
  Administered 2013-09-03: 1000 mL via INTRAVENOUS

## 2013-09-03 MED ORDER — MORPHINE SULFATE 4 MG/ML IJ SOLN
6.0000 mg | Freq: Once | INTRAMUSCULAR | Status: AC
  Administered 2013-09-03: 6 mg via INTRAVENOUS
  Filled 2013-09-03: qty 2

## 2013-09-03 MED ORDER — ONDANSETRON HCL 4 MG PO TABS: 4.0000 mg | ORAL_TABLET | Freq: Four times a day (QID) | ORAL | Status: DC

## 2013-09-03 MED ORDER — IOHEXOL 350 MG/ML SOLN
100.0000 mL | Freq: Once | INTRAVENOUS | Status: AC | PRN
  Administered 2013-09-03: 100 mL via INTRAVENOUS

## 2013-09-03 NOTE — Discharge Instructions (Signed)
Chest Pain (Nonspecific) It is often hard to give a diagnosis for the cause of chest pain. There is always a chance that your pain could be related to something serious, such as a heart attack or a blood clot in the lungs. You need to follow up with your doctor. HOME CARE  If antibiotic medicine was given, take it as directed by your doctor. Finish the medicine even if you start to feel better.  For the next few days, avoid activities that bring on chest pain. Continue physical activities as told by your doctor.  Do not use any tobacco products. This includes cigarettes, chewing tobacco, and e-cigarettes.  Avoid drinking alcohol.  Only take medicine as told by your doctor.  Follow your doctor's suggestions for more testing if your chest pain does not go away.  Keep all doctor visits you made. GET HELP IF:  Your chest pain does not go away, even after treatment.  You have a rash with blisters on your chest.  You have a fever. GET HELP RIGHT AWAY IF:   You have more pain or pain that spreads to your arm, neck, jaw, back, or belly (abdomen).  You have shortness of breath.  You cough more than usual or cough up blood.  You have very bad back or belly pain.  You feel sick to your stomach (nauseous) or throw up (vomit).  You have very bad weakness.  You pass out (faint).  You have chills. This is an emergency. Do not wait to see if the problems will go away. Call your local emergency services (911 in U.S.). Do not drive yourself to the hospital. MAKE SURE YOU:   Understand these instructions.  Will watch your condition.  Will get help right away if you are not doing well or get worse. Document Released: 06/12/2007 Document Revised: 12/29/2012 Document Reviewed: 06/12/2007 [facility] Patient Information 2015 [place], [place]. This information is not intended to replace advice given to you by your health care provider. Make sure you discuss any questions you have with your  health care provider.  Diarrhea Diarrhea is frequent loose and watery bowel movements. It can cause you to feel weak and dehydrated. Dehydration can cause you to become tired and thirsty, have a dry mouth, and have decreased urination that often is dark yellow. Diarrhea is a sign of another problem, most often an infection that will not last long. In most cases, diarrhea typically lasts 2-3 days. However, it can last longer if it is a sign of something more serious. It is important to treat your diarrhea as directed by your caregiver to lessen or prevent future episodes of diarrhea. CAUSES  Some common causes include:  Gastrointestinal infections caused by viruses, bacteria, or parasites.  Food poisoning or food allergies.  Certain medicines, such as antibiotics, chemotherapy, and laxatives.  Artificial sweeteners and fructose.  Digestive disorders. HOME CARE INSTRUCTIONS  Ensure adequate fluid intake (hydration): Have 1 cup (8 oz) of fluid for each diarrhea episode. Avoid fluids that contain simple sugars or sports drinks, fruit juices, whole milk products, and sodas. Your urine should be clear or pale yellow if you are drinking enough fluids. Hydrate with an oral rehydration solution that you can purchase at pharmacies, retail stores, and online. You can prepare an oral rehydration solution at home by mixing the following ingredients together:   - tsp table salt.   tsp baking soda.   tsp salt substitute containing potassium chloride.  1  tablespoons sugar.  1 L (  34 oz) of water.  Certain foods and beverages may increase the speed at which food moves through the gastrointestinal (GI) tract. These foods and beverages should be avoided and include:  Caffeinated and alcoholic beverages.  High-fiber foods, such as raw fruits and vegetables, nuts, seeds, and whole grain breads and cereals.  Foods and beverages sweetened with sugar alcohols, such as xylitol, sorbitol, and  mannitol.  Some foods may be well tolerated and may help thicken stool including:  Starchy foods, such as rice, toast, pasta, low-sugar cereal, oatmeal, grits, baked potatoes, crackers, and bagels.  Bananas.  Applesauce.  Add probiotic-rich foods to help increase healthy bacteria in the GI tract, such as yogurt and fermented milk products.  Wash your hands well after each diarrhea episode.  Only take over-the-counter or prescription medicines as directed by your caregiver.  Take a warm bath to relieve any burning or pain from frequent diarrhea episodes. SEEK IMMEDIATE MEDICAL CARE IF:   You are unable to keep fluids down.  You have persistent vomiting.  You have blood in your stool, or your stools are black and tarry.  You do not urinate in 6-8 hours, or there is only a small amount of very dark urine.  You have abdominal pain that increases or localizes.  You have weakness, dizziness, confusion, or light-headedness.  You have a severe headache.  Your diarrhea gets worse or does not get better.  You have a fever or persistent symptoms for more than 2-3 days.  You have a fever and your symptoms suddenly get worse. MAKE SURE YOU:   Understand these instructions.  Will watch your condition.  Will get help right away if you are not doing well or get worse. Document Released: 12/14/2001 Document Revised: 05/10/2013 Document Reviewed: 09/01/2011 [facility] Patient Information 2015 [place], [place]. This information is not intended to replace advice given to you by your health care provider. Make sure you discuss any questions you have with your health care provider.  Nausea and Vomiting Nausea is a sick feeling that often comes before throwing up (vomiting). Vomiting is a reflex where stomach contents come out of your mouth. Vomiting can cause severe loss of body fluids (dehydration). Children and elderly adults can become dehydrated quickly, especially if they also have  diarrhea. Nausea and vomiting are symptoms of a condition or disease. It is important to find the cause of your symptoms. CAUSES   Direct irritation of the stomach lining. This irritation can result from increased acid production (gastroesophageal reflux disease), infection, food poisoning, taking certain medicines (such as nonsteroidal anti-inflammatory drugs), alcohol use, or tobacco use.  Signals from the brain.These signals could be caused by a headache, heat exposure, an inner ear disturbance, increased pressure in the brain from injury, infection, a tumor, or a concussion, pain, emotional stimulus, or metabolic problems.  An obstruction in the gastrointestinal tract (bowel obstruction).  Illnesses such as diabetes, hepatitis, gallbladder problems, appendicitis, kidney problems, cancer, sepsis, atypical symptoms of a heart attack, or eating disorders.  Medical treatments such as chemotherapy and radiation.  Receiving medicine that makes you sleep (general anesthetic) during surgery. DIAGNOSIS Your caregiver may ask for tests to be done if the problems do not improve after a few days. Tests may also be done if symptoms are severe or if the reason for the nausea and vomiting is not clear. Tests may include:  Urine tests.  Blood tests.  Stool tests.  Cultures (to look for evidence of infection).  X-rays or other imaging  studies. Test results can help your caregiver make decisions about treatment or the need for additional tests. TREATMENT You need to stay well hydrated. Drink frequently but in small amounts.You may wish to drink water, sports drinks, clear broth, or eat frozen ice pops or gelatin dessert to help stay hydrated.When you eat, eating slowly may help prevent nausea.There are also some antinausea medicines that may help prevent nausea. HOME CARE INSTRUCTIONS   Take all medicine as directed by your caregiver.  If you do not have an appetite, do not force yourself to  eat. However, you must continue to drink fluids.  If you have an appetite, eat a normal diet unless your caregiver tells you differently.  Eat a variety of complex carbohydrates (rice, wheat, potatoes, bread), lean meats, yogurt, fruits, and vegetables.  Avoid high-fat foods because they are more difficult to digest.  Drink enough water and fluids to keep your urine clear or pale yellow.  If you are dehydrated, ask your caregiver for specific rehydration instructions. Signs of dehydration may include:  Severe thirst.  Dry lips and mouth.  Dizziness.  Dark urine.  Decreasing urine frequency and amount.  Confusion.  Rapid breathing or pulse. SEEK IMMEDIATE MEDICAL CARE IF:   You have blood or brown flecks (like coffee grounds) in your vomit.  You have black or bloody stools.  You have a severe headache or stiff neck.  You are confused.  You have severe abdominal pain.  You have chest pain or trouble breathing.  You do not urinate at least once every 8 hours.  You develop cold or clammy skin.  You continue to vomit for longer than 24 to 48 hours.  You have a fever. MAKE SURE YOU:   Understand these instructions.  Will watch your condition.  Will get help right away if you are not doing well or get worse. Document Released: 12/24/2004 Document Revised: 03/18/2011 Document Reviewed: 05/23/2010 [facility] Patient Information 2015 [place], [place]. This information is not intended to replace advice given to you by your health care provider. Make sure you discuss any questions you have with your health care provider.   Emergency Department Resource Guide 1) Find a Doctor and Pay Out of Pocket Although you won't have to find out who is covered by your insurance plan, it is a good idea to ask around and get recommendations. You will then need to call the office and see if the doctor you have chosen will accept you as a new patient and what types of options they offer for  patients who are self-pay. Some doctors offer discounts or will set up payment plans for their patients who do not have insurance, but you will need to ask so you aren't surprised when you get to your appointment.  2) Contact Your Local Health Department Not all health departments have doctors that can see patients for sick visits, but many do, so it is worth a call to see if yours does. If you don't know where your local health department is, you can check in your phone book. The CDC also has a tool to help you locate your state's health department, and many state websites also have listings of all of their local health departments.  3) Find a Walk-in Clinic If your illness is not likely to be very severe or complicated, you may want to try a walk in clinic. These are popping up all over the country in pharmacies, drugstores, and shopping centers. They're usually staffed by nurse  practitioners or physician assistants that have been trained to treat common illnesses and complaints. They're usually fairly quick and inexpensive. However, if you have serious medical issues or chronic medical problems, these are probably not your best option.  No Primary Care Doctor: - Call Health Connect at  [phone] - they can help you locate a primary care doctor that  accepts your insurance, provides certain services, etc. - Physician Referral Service- [phone]  Chronic Pain Problems: Organization         Address  Phone   Notes  [facility]  [phone] Patients need to be referred by their primary care doctor.   Medication Assistance: Organization         Address  Phone   Notes  [facility] Medication [facility] [address] [place] [place], [place] [address] [phone] --Must be a resident of [facility] -- Must have NO insurance coverage whatsoever (no Medicaid/ Medicare, etc.) -- The pt. MUST have a primary care doctor that directs their care regularly  and follows them in the community   [facility]ist  [phone]   [employer]  [phone]    Agencies that provide inexpensive medical care: Organization         Address  Phone   Notes  [facility]  [phone]   [facility]    [phone]   [facility] [address] [place], [place] [address] [phone]   [facility] [address] [place] [phone]   Planned Parenthood    [phone]   [facility]    [phone]   [facility]  [address], [place] Phone:  [phone], Fax:  [phone] Hours of Operation:  9 am - 6 pm, M-F.  Also accepts Medicaid/Medicare and self-pay.  [facility]  [address], [place] Phone: [phone], Fax: [phone]. Hours of Operation:  8:30 am - 5:30 pm, M-F.  Also accepts Medicaid and self-pay.  [facility] [place] [address], [place] Phone: [phone]   [facility] [address] Natasha Bence [place], [place] [phone], Ext. [phone] Mondays & Thursdays: 7-9 AM.  First 15 patients are seen on a first come, first serve basis.    Medicaid-accepting [facility] Providers:  Organization         Address  Phone   Notes  [facility] [address], [place] [phone] Also accepts self-pay patients.  [facility] [address] Laurell Josephs [place], [place]  [phone]   [facility] [address], [place] [phone]   [facility] [address] [place] [phone]   Renaye Rakers [address], [place]   [phone] Only accepts [place] Access [place] patients after they have their name applied to their card.   Self-Pay (no insurance) in [facility]:  Organization         Address  Phone   Notes  Sickle  Cell Patients, [facility] [address] [place], [place] [phone]   [facility] [address] [place], [place] [phone]   [facility]  [address], [place] [phone]   [facility]/Dr. Julio Sicks  [address], [place] or [address], [place] [phone] Phone number for both [place] and [place] locations is the same.  [facility]gent Medical and [facility] [address], [place] [phone]   [facility] [address], [place] or [address] [phone]   [facility] [address], [place] [phone], phone; [phone], fax Sees patients 1st and 3rd Saturday of every month.  Must not qualify for public or private insurance (i.e. Medicaid, Medicare, [place] Health Choice, Veterans' Benefits)  Household income should be no more than 200% of the poverty level The clinic cannot treat you if you are pregnant or think you are pregnant  Sexually transmitted diseases are not treated at the clinic.    Dental Care: Organization         Address  Phone  Notes  [facility] Department of [facility] [address] [place], [place] [phone] Accepts children up to age 75 who are enrolled in [place] or [place] Health Choice; pregnant women with a Medicaid card; and children who have applied for Medicaid or [place] Health Choice, but were declined, whose parents can pay a reduced fee at time of service.  [facility] Department of [facility]  [address], [place] [phone] Accepts children up to age 60 who are enrolled in [place] or [place] Health Choice; pregnant women with a Medicaid card; and children who have applied for Medicaid or [place] Health Choice, but were declined, whose parents can pay a reduced fee at time of service.  [facility]  [address] [place], [place] [phone] Patients are seen by appointment only. Walk-ins are not accepted. [facility] will see patients 67 years of age and older. Monday - Tuesday (8am-5pm) Most Wednesdays (8:30-5pm) $30 per visit, cash only  [facility] Adult [facility] Access PROGRAM  [address], [facility] [phone] Patients are seen by appointment only. Walk-ins are not accepted. [facility] will see patients 88 years of age and older. One Wednesday Evening (Monthly: Volunteer Based).  $30 per visit, cash only  [employer]  [phone] for adults; Children under age 17, call Graduate Pediatric Dentistry at [phone]. Children aged 16-14, please call [phone] to request a pediatric application.  Dental services are provided in all areas of dental care including fillings, crowns and bridges, complete and partial dentures, implants, gum treatment, root canals, and extractions. Preventive care is also provided. Treatment is provided to both adults and children. Patients are selected via a lottery and there is often a waiting list.   [facility] [address] [place]  [phone] [email or web address]   [facility] [address] [place], [place] [phone], Ext. [phone] Second and Fourth Thursday of each month, opens at 6:30 AM; Clinic ends at 9 AM.  Patients are seen on a first-come first-served basis, and a limited number are seen during each clinic.   [facility]  [address] Ether Griffins [place], [place] [phone]   Eligibility Requirements You must have lived in [place], [place], or [place] counties for at least the last three months.   You cannot be eligible for state or federal sponsored [place], including [employer], [place], or [employer].   You generally cannot be eligible for healthcare insurance through your employer.  How to apply: Eligibility  screenings are held every Tuesday and Wednesday afternoon from 1:00 pm until 4:00 pm. You do not need an appointment for the interview!  [facility] [address] [place], [place] [phone]   [facility] Health Department  [phone]   [facility] Health Department  [phone]   [facility] Health Department  [phone]    Behavioral Health Resources in the Community: Intensive Outpatient Programs Organization         Address  Phone  Notes  [facility] [address] [place], [place] [phone]   [facility] [address] [place], [place] [phone]   [facility] [address] [place], [place]  [phone]   [facility] [address],  [place], [place] [phone] or [phone]   Substance Abuse Resources Organization         Address  Phone  Notes  [facility] Drug Services  [phone]   [facility]  [phone]   [facility] [place]  [phone]   Floydene Flock  [phone]   Residential & Outpatient Substance Abuse Program  [phone]   Psychological Services Organization         Address  Phone  Notes  [facility]  [phone]   [facility]  [phone]   [facility] Mental Health [address] [place] [phone] or [phone]    Mobile Crisis Teams Organization         Address  Phone  Notes  [facility], Mobile Crisis Care Unit  [phone]   [facility]  [address] [place], [place] [phone]   Doristine Locks [address] [place] [phone]    Self-Help/Support Groups Organization         Address  Phone             Notes  [facility] [place] - variety of support groups  336- I7437963 Call for more information  Narcotics Anonymous (NA), [facility] [address], [employer] [place]  2 meetings at  this location   Statistician         Address  Phone  Notes  [facility] [address],    [place]  [phone]   [facility]  [address], [place] [address], [place], [place] [phone]   [facility] [address] [place], [place] [phone] Admissions: 8am-3pm M-F  [facility] [address] [place] [address]    [place], [place] [phone]   [facility] [address] [place], [place], [place] [phone]   [facility] [address]  [place], [place] [phone]   [facility] - Intensive Outpatient [address]ure[address], [place], [place] [phone]   [facility] (Addiction [facility] [address] [place]  [place], [place] [phone] or [phone]   [facility] ([facility]) [address], [place], [place] [phone] Accepts Medicaid  [facility] [address]  [place] [phone] Substance Abuse/Addiction Treatment   [facility] Organization         Address  Phone  Notes  [facility]  [phone]   Angie Fava, PhD [address] Ervin Knack [place], [place]   [phone] or [phone]   [facility] Behavioral   [address] [place], [place] [phone]   [facility] [address] [place], [place] [phone] Insurance/Medicaid/sponsorship through [employer] and Families [address]                                    [place], [place] [phone] Therapy/tele-psych/case  [facility] [address]   [place], [place] [phone]    Dr. Lolly Mustache  [phone]   [facility]  [employer] [facility] Dept. 1) [address], [place] 2) [address] [place] 3)  [address], [place] [phone]  [phone]   [facility] Child Abuse Hotline [phone])810-7622 or [phone] (After Hours)

## 2013-09-03 NOTE — ED Notes (Signed)
Per EMS, pt comes from home with c/o severe chest pain. Pt A&OX4, NAD noted. Pt c/o feeling like "an elephant is sitting on chest." Pt reports a headache behind right eye. Pt has had the stomach virus with n/v. VSS: BP 138/98, P115, R16, 97% on 2L. 20G IV placed in left hand. Pt given  ASA and 1 nitro tab.

## 2013-09-03 NOTE — ED Provider Notes (Signed)
CSN: [id number]     Arrival date & time 09/03/13  1307 History   First MD Initiated Contact with Patient 09/03/13 1336     Chief Complaint  Patient presents with  . Chest Pain     (Consider location/radiation/quality/duration/timing/severity/associated sxs/prior Treatment) HPI Patient reports about 3 days ago he started having a gastroenteritis and has had vomiting 3-4 times a day with diarrhea 3-4 times a day that he describes as loose and watery. He has not had any abdominal pain. He's had some achiness in his lower back. He states he tried to go to work today however he did not feel well. While he was driving home from work he had acute onset of chest pain in the center of his chest that feels "like an L. sitting on my chest". He has shortness of breath, dizziness, diaphoresis of his face, nausea without vomiting while driving. He states he's never had this pain before. He also states at the same time he started having a right-sided headache behind his eyes that he describes as sharp with photophobia. He states he's never had that before. Patient denies being under any extra stress recently.   Patient states his 64 year old brother died in his sleep and it was presumed he had a heart attack. Otherwise no family history of heart disease.  PCP none  Past Medical History  Diagnosis Date  . Alcohol abuse   . Anxiety   . Depression   . Hypertension    Past Surgical History  Procedure Laterality Date  . Finger surgery     History reviewed. No pertinent family history. History  Substance Use Topics  . Smoking status: Current Every Day Smoker -- 1.00 packs/day  . Smokeless tobacco: Never Used  . Alcohol Use: No     Comment: 3 drinks of vodka a day   patient works in [employer] Patient states he was sober for 340 days, he started treating 2 weeks ago. He states he drinks a glass of wine off and on.  Review of Systems  All other systems reviewed and are  negative.     Allergies  Review of patient's allergies indicates no known allergies.  Home Medications   Prior to Admission medications   Medication Sig Start Date End Date Taking? Authorizing Provider  Multiple Vitamins-Minerals (MULTIVITAMIN WITH MINERALS) tablet Take 1 tablet by mouth daily.   Yes Historical Provider, MD   BP 125/87  Pulse 104  Temp(Src) 98.8 F (37.1 C) (Oral)  Resp 20  Ht  (1.727 m)  Wt 240 lb (108.863 kg)  BMI 36.50 kg/m2  SpO2 91%  Vital signs normal except tachycardia  Physical Exam  Nursing note and vitals reviewed. Constitutional: He is oriented to person, place, and time. He appears well-developed and well-nourished.  Non-toxic appearance. He does not appear ill. No distress.  HENT:  Head: Normocephalic and atraumatic.  Right Ear: External ear normal.  Left Ear: External ear normal.  Nose: Nose normal. No mucosal edema or rhinorrhea.  Mouth/Throat: Oropharynx is clear and moist and mucous membranes are normal. No dental abscesses or uvula swelling.  Tongue dry  Eyes: Conjunctivae and EOM are normal. Pupils are equal, round, and reactive to light.  Neck: Normal range of motion and full passive range of motion without pain. Neck supple.  Cardiovascular: Normal rate, regular rhythm and normal heart sounds.  Exam reveals no gallop and no friction rub.   No murmur heard. Pulmonary/Chest: Effort normal and breath sounds normal. No  respiratory distress. He has no wheezes. He has no rhonchi. He has no rales. He exhibits no tenderness and no crepitus.    Mildly tender anterior chest  Abdominal: Soft. Normal appearance and bowel sounds are normal. He exhibits no distension. There is no tenderness. There is no rebound and no guarding.  Musculoskeletal: Normal range of motion. He exhibits no edema and no tenderness.  Moves all extremities well.   Neurological: He is alert and oriented to person, place, and time. He has normal strength. No cranial  nerve deficit.  Skin: Skin is warm, dry and intact. No rash noted. No erythema. No pallor.  Psychiatric: His speech is normal and behavior is normal. His mood appears anxious.    ED Course  Procedures (including critical care time)  Medications  0.9 %  sodium chloride infusion (0 mLs Intravenous Stopped 09/03/13 1536)    Followed by  0.9 %  sodium chloride infusion (1,000 mLs Intravenous New Bag/Given 09/03/13 1427)    Followed by  0.9 %  sodium chloride infusion (not administered)  morphine 4 MG/ML injection 4 mg (not administered)  metoCLOPramide (REGLAN) injection 10 mg (10 mg Intravenous Given 09/03/13 1427)  diphenhydrAMINE (BENADRYL) injection 25 mg (25 mg Intravenous Given 09/03/13 1427)  morphine 4 MG/ML injection 4 mg (4 mg Intravenous Given 09/03/13 1427)    15:35 Pt given his test results. Still c/o headache and chest pain that has only changed minimally.Given more medications, CT of head and angio chest ordered.   16:18 Pt left at change of shift with Dr Juleen China to get second troponin result, head CT results and CT angio results.   Labs Review Results for orders placed during the hospital encounter of 09/03/13  CBC      Result Value Ref Range   WBC 9.1  4.0 - 10.5 K/uL   RBC 5.11  4.22 - 5.81 MIL/uL   Hemoglobin 16.7  13.0 - 17.0 g/dL   HCT 16.1  09.6 - 04.5 %   MCV 90.2  78.0 - 100.0 fL   MCH 32.7  26.0 - 34.0 pg   MCHC 36.2 (*) 30.0 - 36.0 g/dL   RDW 40.9  81.1 - 91.4 %   Platelets 252  150 - 400 K/uL  BASIC METABOLIC PANEL      Result Value Ref Range   Sodium 136 (*) 137 - 147 mEq/L   Potassium 3.9  3.7 - 5.3 mEq/L   Chloride 98  96 - 112 mEq/L   CO2 21  19 - 32 mEq/L   Glucose, Bld 74  70 - 99 mg/dL   BUN 9  6 - 23 mg/dL   Creatinine, Ser 7.82  0.50 - 1.35 mg/dL   Calcium 8.8  8.4 - 95.6 mg/dL   GFR calc non Af Amer >90  >90 mL/min   GFR calc Af Amer >90  >90 mL/min   Anion gap 17 (*) 5 - 15  I-STAT TROPOININ, ED      Result Value Ref Range   Troponin i,  poc 0.00  0.00 - 0.08 ng/mL   Comment 3            Laboratory interpretation all normal   Imaging Review Dg Chest Port 1 View  09/03/2013   CLINICAL DATA:  43 year old male with chest pain, pressure, headaches. Vomiting and diarrhea earlier this week. Initial encounter.  EXAM: PORTABLE CHEST - 1 VIEW  COMPARISON:  10/22/2012.  FINDINGS: Portable AP semi upright view at 1411 hrs. Lung  volumes are stable, within normal limits. Mild elevation the right hemidiaphragm. Normal cardiac size and mediastinal contours. Visualized tracheal air column is within normal limits. Allowing for portable technique, the lungs are clear.  IMPRESSION: No acute cardiopulmonary abnormality.   Electronically Signed   By: Augusto Gamble M.D.   On: 09/03/2013 14:19   #1  Date: 09/03/2013  Rate: 113  Rhythm: sinus tachycardia  QRS Axis: normal  Intervals: normal  ST/T Wave abnormalities: nonspecific T wave changes  Conduction Disutrbances:none  Narrative Interpretation:   Old EKG Reviewed: none available   #2  EKG Interpretation   Date/Time:  Friday September 03 2013 15:43:20 EDT Ventricular Rate:  106 PR Interval:  142 QRS Duration: 87 QT Interval:  335 QTC Calculation: 445 R Axis:   87 Text Interpretation:  Sinus tachycardia Ventricular premature complex  Since last tracing 22 Oct 2012 Borderline T abnormalities, inferior leads also present on EKG earlier today Confirmed by [facility]  MD-I, Jaley Yan ([id number]) on 09/03/2013 3:48:23 PM           MDM   Final diagnoses:  Nausea vomiting and diarrhea  Dehydration  Headache, unspecified headache type  Chest pain, unspecified chest pain type     Disposition pending.  Devoria Albe, MD, FACEP    Ward Givens, MD 09/03/13 [phone]

## 2013-09-03 NOTE — ED Notes (Signed)
Dr.Knapp at bedside  

## 2013-09-03 NOTE — ED Notes (Signed)
Patient transported to CT 

## 2013-09-03 NOTE — ED Notes (Signed)
Radiology at bedside

## 2013-09-03 NOTE — ED Provider Notes (Signed)
Assumed care in sign out. Pt with several complaints. HA, CP, n/v, vomiting. Still has symptoms, although improved. NO new complaints. W/u has been fairly unremarkable. Initial EKG w/o overt ischemic changes. Repeat EKG not significantly changes. Trop normal x2. Imaging w/o acute abnormality. Doubt ACS, dissection, severe infection, PE or other potentially emergent process. Return precautions discussed. Continued symptomatix tx otherwise.   Raeford Razor, MD 09/03/13 [phone]

## 2013-09-03 NOTE — ED Notes (Signed)
Dr. Kohut at bedside 

## 2013-09-06 NOTE — Discharge Planning (Signed)
[facility] Community Liaison was not able to see pt, [facility] orange card information will be sent to the address listed. °

## 2013-09-16 ENCOUNTER — Encounter (HOSPITAL_COMMUNITY): Payer: Self-pay | Admitting: Emergency Medicine

## 2013-09-16 ENCOUNTER — Encounter (HOSPITAL_COMMUNITY): Payer: Self-pay | Admitting: *Deleted

## 2013-09-16 ENCOUNTER — Emergency Department (HOSPITAL_COMMUNITY)
Admission: EM | Admit: 2013-09-16 | Discharge: 2013-09-16 | Disposition: A | Discharge status: Other Acute Inpatient Hospital | Payer: Self-pay | Attending: Emergency Medicine | Admitting: Emergency Medicine

## 2013-09-16 ENCOUNTER — Observation Stay (HOSPITAL_COMMUNITY)
Admission: AD | Admit: 2013-09-16 | Discharge: 2013-09-19 | Disposition: A | Discharge status: Home / Self Care | Payer: Self-pay | Source: Intra-hospital | Attending: Emergency Medicine | Admitting: Emergency Medicine

## 2013-09-16 DIAGNOSIS — F17200 Nicotine dependence, unspecified, uncomplicated: Secondary | ICD-10-CM | POA: Insufficient documentation

## 2013-09-16 DIAGNOSIS — F329 Major depressive disorder, single episode, unspecified: Secondary | ICD-10-CM | POA: Insufficient documentation

## 2013-09-16 DIAGNOSIS — F10230 Alcohol dependence with withdrawal, uncomplicated: Secondary | ICD-10-CM

## 2013-09-16 DIAGNOSIS — F10229 Alcohol dependence with intoxication, unspecified: Secondary | ICD-10-CM | POA: Insufficient documentation

## 2013-09-16 DIAGNOSIS — F10930 Alcohol use, unspecified with withdrawal, uncomplicated: Secondary | ICD-10-CM

## 2013-09-16 DIAGNOSIS — Z09 Encounter for follow-up examination after completed treatment for conditions other than malignant neoplasm: Secondary | ICD-10-CM

## 2013-09-16 DIAGNOSIS — F10220 Alcohol dependence with intoxication, uncomplicated: Secondary | ICD-10-CM

## 2013-09-16 DIAGNOSIS — F10239 Alcohol dependence with withdrawal, unspecified: Secondary | ICD-10-CM | POA: Insufficient documentation

## 2013-09-16 DIAGNOSIS — F3289 Other specified depressive episodes: Secondary | ICD-10-CM | POA: Insufficient documentation

## 2013-09-16 DIAGNOSIS — F411 Generalized anxiety disorder: Secondary | ICD-10-CM | POA: Insufficient documentation

## 2013-09-16 DIAGNOSIS — Z9289 Personal history of other medical treatment: Secondary | ICD-10-CM

## 2013-09-16 DIAGNOSIS — I10 Essential (primary) hypertension: Secondary | ICD-10-CM | POA: Insufficient documentation

## 2013-09-16 DIAGNOSIS — F10939 Alcohol use, unspecified with withdrawal, unspecified: Principal | ICD-10-CM | POA: Insufficient documentation

## 2013-09-16 LAB — COMPREHENSIVE METABOLIC PANEL
ALT: 25 U/L (ref 0–53)
ANION GAP: 19 — AB (ref 5–15)
AST: 29 U/L (ref 0–37)
Albumin: 4.2 g/dL (ref 3.5–5.2)
Alkaline Phosphatase: 81 U/L (ref 39–117)
BUN: 7 mg/dL (ref 6–23)
CO2: 22 meq/L (ref 19–32)
Calcium: 9.2 mg/dL (ref 8.4–10.5)
Chloride: 100 mEq/L (ref 96–112)
Creatinine, Ser: 0.67 mg/dL (ref 0.50–1.35)
GFR calc non Af Amer: >90 mL/min (ref >90)
GLUCOSE: 128 mg/dL — AB (ref 70–99)
POTASSIUM: 3.9 meq/L (ref 3.7–5.3)
SODIUM: 141 meq/L (ref 137–147)
TOTAL PROTEIN: 7.3 g/dL (ref 6.0–8.3)
Total Bilirubin: 0.5 mg/dL (ref 0.3–1.2)

## 2013-09-16 LAB — CBC
HEMATOCRIT: 49.2 % (ref 39.0–52.0)
HEMOGLOBIN: 17.9 g/dL — AB (ref 13.0–17.0)
MCH: 33.3 pg (ref 26.0–34.0)
MCHC: 36.4 g/dL — AB (ref 30.0–36.0)
MCV: 91.6 fL (ref 78.0–100.0)
Platelets: 230 10*3/uL (ref 150–400)
RBC: 5.37 MIL/uL (ref 4.22–5.81)
RDW: 14.7 % (ref 11.5–15.5)
WBC: 9.1 10*3/uL (ref 4.0–10.5)

## 2013-09-16 LAB — ACETAMINOPHEN LEVEL: Acetaminophen (Tylenol), Serum: <15 ug/mL (ref 10–30)

## 2013-09-16 LAB — RAPID URINE DRUG SCREEN, HOSP PERFORMED
AMPHETAMINES: NOT DETECTED
BARBITURATES: NOT DETECTED
Benzodiazepines: NOT DETECTED
Cocaine: NOT DETECTED
Opiates: NOT DETECTED
Tetrahydrocannabinol: NOT DETECTED

## 2013-09-16 LAB — SALICYLATE LEVEL: Salicylate Lvl: <2 mg/dL — ABNORMAL LOW (ref 2.8–20.0)

## 2013-09-16 LAB — ETHANOL: Alcohol, Ethyl (B): 254 mg/dL — ABNORMAL HIGH (ref 0–11)

## 2013-09-16 MED ORDER — IBUPROFEN 600 MG PO TABS
600.0000 mg | ORAL_TABLET | Freq: Three times a day (TID) | ORAL | Status: DC | PRN
  Administered 2013-09-16 – 2013-09-17 (×3): 600 mg via ORAL
  Filled 2013-09-16: qty 1
  Filled 2013-09-16: qty 3
  Filled 2013-09-16: qty 1

## 2013-09-16 MED ORDER — ALUM & MAG HYDROXIDE-SIMETH 200-200-20 MG/5ML PO SUSP: 30.0000 mL | ORAL | Status: DC | PRN

## 2013-09-16 MED ORDER — CHLORDIAZEPOXIDE HCL 25 MG PO CAPS: 25.0000 mg | ORAL_CAPSULE | Freq: Once | ORAL | Status: DC

## 2013-09-16 MED ORDER — THIAMINE HCL 100 MG/ML IJ SOLN: 100.0000 mg | Freq: Every day | INTRAMUSCULAR | Status: DC

## 2013-09-16 MED ORDER — LORAZEPAM 1 MG PO TABS: 0.0000 mg | ORAL_TABLET | Freq: Two times a day (BID) | ORAL | Status: DC

## 2013-09-16 MED ORDER — CHLORDIAZEPOXIDE HCL 25 MG PO CAPS
25.0000 mg | ORAL_CAPSULE | Freq: Four times a day (QID) | ORAL | Status: DC
  Administered 2013-09-16: 25 mg via ORAL
  Filled 2013-09-16: qty 1

## 2013-09-16 MED ORDER — IBUPROFEN 200 MG PO TABS: 600.0000 mg | ORAL_TABLET | Freq: Three times a day (TID) | ORAL | Status: DC | PRN

## 2013-09-16 MED ORDER — CHLORDIAZEPOXIDE HCL 25 MG PO CAPS: 25.0000 mg | ORAL_CAPSULE | ORAL | Status: DC

## 2013-09-16 MED ORDER — CHLORDIAZEPOXIDE HCL 25 MG PO CAPS
25.0000 mg | ORAL_CAPSULE | Freq: Once | ORAL | Status: DC
Start: 2013-09-16 — End: 2013-09-16
  Administered 2013-09-16: 25 mg via ORAL
  Filled 2013-09-16: qty 1

## 2013-09-16 MED ORDER — ACETAMINOPHEN 325 MG PO TABS: 650.0000 mg | ORAL_TABLET | ORAL | Status: DC | PRN

## 2013-09-16 MED ORDER — CHLORDIAZEPOXIDE HCL 25 MG PO CAPS: 25.0000 mg | ORAL_CAPSULE | Freq: Every day | ORAL | Status: DC

## 2013-09-16 MED ORDER — CHLORDIAZEPOXIDE HCL 25 MG PO CAPS
25.0000 mg | ORAL_CAPSULE | Freq: Four times a day (QID) | ORAL | Status: DC | PRN
Start: 2013-09-16 — End: 2013-09-19

## 2013-09-16 MED ORDER — CHLORDIAZEPOXIDE HCL 25 MG PO CAPS: 25.0000 mg | ORAL_CAPSULE | Freq: Four times a day (QID) | ORAL | Status: DC | PRN

## 2013-09-16 MED ORDER — NICOTINE 21 MG/24HR TD PT24
21.0000 mg | MEDICATED_PATCH | Freq: Every day | TRANSDERMAL | Status: DC
  Administered 2013-09-17 – 2013-09-19 (×3): 21 mg via TRANSDERMAL
  Filled 2013-09-16 (×6): qty 1

## 2013-09-16 MED ORDER — LORAZEPAM 1 MG PO TABS
0.0000 mg | ORAL_TABLET | Freq: Four times a day (QID) | ORAL | Status: DC
  Filled 2013-09-16: qty 1

## 2013-09-16 MED ORDER — VITAMIN B-1 100 MG PO TABS
100.0000 mg | ORAL_TABLET | Freq: Every day | ORAL | Status: DC
  Administered 2013-09-16: 100 mg via ORAL
  Filled 2013-09-16: qty 1

## 2013-09-16 MED ORDER — ACETAMINOPHEN 325 MG PO TABS
650.0000 mg | ORAL_TABLET | ORAL | Status: DC | PRN
  Administered 2013-09-17: 650 mg via ORAL
  Filled 2013-09-16: qty 2

## 2013-09-16 MED ORDER — CHLORDIAZEPOXIDE HCL 25 MG PO CAPS
25.0000 mg | ORAL_CAPSULE | ORAL | Status: DC
Start: 2013-09-19 — End: 2013-09-19
  Administered 2013-09-19: 25 mg via ORAL
  Filled 2013-09-16: qty 1

## 2013-09-16 MED ORDER — CHLORDIAZEPOXIDE HCL 25 MG PO CAPS
25.0000 mg | ORAL_CAPSULE | Freq: Three times a day (TID) | ORAL | Status: AC
  Administered 2013-09-18 (×3): 25 mg via ORAL
  Filled 2013-09-16 (×3): qty 1

## 2013-09-16 MED ORDER — VITAMIN B-1 100 MG PO TABS
100.0000 mg | ORAL_TABLET | Freq: Every day | ORAL | Status: DC
  Administered 2013-09-17 – 2013-09-19 (×3): 100 mg via ORAL
  Filled 2013-09-16 (×6): qty 1

## 2013-09-16 MED ORDER — TRAZODONE HCL 50 MG PO TABS
50.0000 mg | ORAL_TABLET | Freq: Every evening | ORAL | Status: DC | PRN
  Administered 2013-09-16 – 2013-09-18 (×3): 50 mg via ORAL
  Filled 2013-09-16 (×3): qty 1

## 2013-09-16 MED ORDER — NICOTINE 21 MG/24HR TD PT24
21.0000 mg | MEDICATED_PATCH | Freq: Every day | TRANSDERMAL | Status: DC
  Administered 2013-09-16: 21 mg via TRANSDERMAL
  Filled 2013-09-16: qty 1

## 2013-09-16 MED ORDER — MAGNESIUM HYDROXIDE 400 MG/5ML PO SUSP: 30.0000 mL | Freq: Every day | ORAL | Status: DC | PRN

## 2013-09-16 MED ORDER — ONDANSETRON 4 MG PO TBDP
4.0000 mg | ORAL_TABLET | Freq: Four times a day (QID) | ORAL | Status: DC | PRN
  Administered 2013-09-16: 4 mg via ORAL
  Filled 2013-09-16: qty 1

## 2013-09-16 MED ORDER — LOPERAMIDE HCL 2 MG PO CAPS: 2.0000 mg | ORAL_CAPSULE | ORAL | Status: DC | PRN

## 2013-09-16 MED ORDER — ONDANSETRON HCL 4 MG PO TABS: 4.0000 mg | ORAL_TABLET | Freq: Three times a day (TID) | ORAL | Status: DC | PRN

## 2013-09-16 MED ORDER — CHLORDIAZEPOXIDE HCL 25 MG PO CAPS
25.0000 mg | ORAL_CAPSULE | Freq: Four times a day (QID) | ORAL | Status: AC
  Administered 2013-09-16 – 2013-09-17 (×4): 25 mg via ORAL
  Filled 2013-09-16 (×4): qty 1

## 2013-09-16 MED ORDER — HYDROXYZINE HCL 25 MG PO TABS
25.0000 mg | ORAL_TABLET | Freq: Four times a day (QID) | ORAL | Status: DC | PRN
  Administered 2013-09-16 – 2013-09-19 (×3): 25 mg via ORAL
  Filled 2013-09-16 (×3): qty 1

## 2013-09-16 MED ORDER — HYDROXYZINE HCL 25 MG PO TABS
25.0000 mg | ORAL_TABLET | Freq: Four times a day (QID) | ORAL | Status: DC | PRN
Start: 2013-09-16 — End: 2013-09-16

## 2013-09-16 MED ORDER — CHLORDIAZEPOXIDE HCL 25 MG PO CAPS: 25.0000 mg | ORAL_CAPSULE | Freq: Three times a day (TID) | ORAL | Status: DC

## 2013-09-16 MED ORDER — ONDANSETRON 4 MG PO TBDP
4.0000 mg | ORAL_TABLET | Freq: Four times a day (QID) | ORAL | Status: DC | PRN
Start: 2013-09-16 — End: 2013-09-19

## 2013-09-16 NOTE — ED Notes (Signed)
Patient transferred from ED to [facility].  States he has been sober for 344 days and had been through the [facility] program last year.  States he has already contacted them and hopes to be able to get back in there after he is medically cleared.  States he was in a relationship and the woman ended it abruptly which put him into a depressive state, when he took his first drink he couldn't stop.  He is pleasant and cooperative, but sad.

## 2013-09-16 NOTE — BH Assessment (Signed)
Assessment Note  Jonathon [name]. Pt presents voluntarily with request for alcohol detox. Pt sts he had been sober for 344 days and relapsed 08/28/13. Pt sts his girlfriend broke up with him 08/28/13. Pt sts he has been drinking approx 8 to 10 vodka drinks daily since 08/28/13. Pt denies SI and HI. He denies [facility] and no delusion noted. Per chart review, pt was admitted to [facility] Sept 2014 for alcohol dependence and then was transferred to [facility]. Pt reports that being at [facility] helped him tremendously. Pt sts he called [facility] which required medical clearance. 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 and he works at [employer]. Pt lives alone. He states he wants to get sober for his two sons (ages 42 and 39). He reports depressed mood with insomnia, isolating behavior and loss of interest in usual pleasures. He also reports poor appetite. Pt sts he has stayed sober by "using God at my sponsor." Pt cooperative and oriented x 4.  Writer ran pt by Assunta Found NP who accepts pt to an OBS bed.   Axis I: Alcohol Use Disorder, Severe Axis II: Deferred Axis III:  Past Medical History  Diagnosis Date  . Alcohol abuse   . Anxiety   . Depression   . Hypertension    Axis IV: other psychosocial or environmental problems and problems related to social environment Axis V: 41-50 serious symptoms  Past Medical History:  Past Medical History  Diagnosis Date  . Alcohol abuse   . Anxiety   . Depression   . Hypertension     Past Surgical History  Procedure Laterality Date  . Finger surgery      Family History: No family history on file.  Social History:  reports that he has been smoking.  He has never used smokeless tobacco. He reports that he drinks about 5 ounces of alcohol per week. He reports that he does not use illicit drugs.  Additional Social History:  Alcohol / Drug Use Pain Medications: see PTA meds list - pt denies abuse Prescriptions: see PTA meds  list - pt denies abuse Over the Counter: see PTA meds list - pt denies abuse History of alcohol / drug use?: Yes Longest period of sobriety (when/how long): 344 days Negative Consequences of Use: Personal relationships;Financial Substance #1 Name of Substance 1: alcohol 1 - Age of First Use: 15 1 - Amount (size/oz): 8 to 10 vodka drinks 1 - Frequency: daily 1 - Duration: since 08/28/13 1 - Last Use / Amount: 09/16/13 - 8 vodka drinks  CIWA: CIWA-Ar BP: 142/96 mmHg Pulse Rate: 110 COWS:    Allergies: No Known Allergies  Home Medications:  (Not in a hospital admission)  OB/GYN Status:  No LMP for male patient.  General Assessment Data Location of Assessment: [facility] ED Is this a Tele or Face-to-Face Assessment?: Face-to-Face Is this an Initial Assessment or a Re-assessment for this encounter?: Initial Assessment Living Arrangements: Alone Can pt return to current living arrangement?: Yes Admission Status: Voluntary Is patient capable of signing voluntary admission?: Yes Transfer from: Home Referral Source: Self/Family/Friend     [facility] Crisis Care Plan Living Arrangements: Alone Name of Psychiatrist: none Name of Therapist: none  Education Status Is patient currently in school?: No Highest grade of school patient has completed: 16 Name of school: [employer]  Risk to self with the past 6 months Suicidal Ideation: No Suicidal Intent: No Is patient at risk for suicide?: No  Suicidal Plan?: No Access to Means: No What has been your use of drugs/alcohol within the last 12 months?: daily alcohol use since 08/28/13 Previous Attempts/Gestures: No How many times?: 0 Other Self Harm Risks: none Triggers for Past Attempts:  (n/a) Intentional Self Injurious Behavior: None Family Suicide History: No Recent stressful life event(s): Trauma (Comment);Other (Comment) (recent breakup, recent relapse) Persecutory voices/beliefs?: No Depression: Yes Depression Symptoms:  Insomnia;Loss of interest in usual pleasures;Isolating Substance abuse history and/or treatment for substance abuse?: Yes Suicide prevention information given to non-admitted patients: Not applicable  Risk to Others within the past 6 months Homicidal Ideation: No Thoughts of Harm to Others: No Current Homicidal Intent: No Current Homicidal Plan: No Access to Homicidal Means: No Identified Victim: none History of harm to others?: No Assessment of Violence: None Noted Violent Behavior Description: none Does patient have access to weapons?: No Criminal Charges Pending?: No Does patient have a court date: No  Psychosis Hallucinations: None noted Delusions: None noted  Mental Status Report Appear/Hygiene: In scrubs;Unremarkable Eye Contact: Good Motor Activity: Freedom of movement Speech: Logical/coherent Level of Consciousness: Alert Mood: Depressed;Anhedonia;Sad Affect: Appropriate to circumstance;Depressed;Sad Anxiety Level: Minimal Thought Processes: Coherent;Relevant Judgement: Unimpaired Orientation: Person;Place;Time;Situation Obsessive Compulsive Thoughts/Behaviors: None  Cognitive Functioning Concentration: Normal Memory: Recent Intact;Remote Intact IQ: Average Insight: Good Impulse Control: Poor Appetite: Poor Weight Loss:  (pt sts has probably lost weight) Sleep: Decreased Total Hours of Sleep: 4 (pt sts has had insomnia for a while) Vegetative Symptoms: None  ADLScreening [facility] Assessment Services) Patient's cognitive ability adequate to safely complete daily activities?: Yes Patient able to express need for assistance with ADLs?: Yes Independently performs ADLs?: Yes (appropriate for developmental age)  Prior Inpatient Therapy Prior Inpatient Therapy: Yes Prior Therapy Dates: Sept 2014 Prior Therapy Facilty/Provider(s): [facility] & then [facility] Reason for Treatment: alcohol use disorder  Prior Outpatient Therapy Prior Outpatient Therapy:  No Prior Therapy Dates: na Prior Therapy Facilty/Provider(s): na Reason for Treatment: na  ADL Screening (condition at time of admission) Patient's cognitive ability adequate to safely complete daily activities?: Yes Is the patient deaf or have difficulty hearing?: No Does the patient have difficulty seeing, even when wearing glasses/contacts?: No Does the patient have difficulty concentrating, remembering, or making decisions?: No Patient able to express need for assistance with ADLs?: Yes Does the patient have difficulty dressing or bathing?: No Independently performs ADLs?: Yes (appropriate for developmental age) Does the patient have difficulty walking or climbing stairs?: No Weakness of Legs: None Weakness of Arms/Hands: None  Home Assistive Devices/Equipment Home Assistive Devices/Equipment: None    Abuse/Neglect Assessment (Assessment to be complete while patient is alone) Physical Abuse: Denies Verbal Abuse: Denies Sexual Abuse: Denies Exploitation of patient/patient's resources: Denies Self-Neglect: Denies Values / Beliefs Cultural Requests During Hospitalization: None Spiritual Requests During Hospitalization: None   Advance Directives (For Healthcare) Does patient have an advance directive?: No Would patient like information on creating an advanced directive?: No - patient declined information    Additional Information 1:1 In Past 12 Months?: No CIRT Risk: No Elopement Risk: No Does patient have medical clearance?: Yes     Disposition:  Disposition Initial Assessment Completed for this Encounter: Yes Disposition of Patient: Other dispositions Other disposition(s): Other (Comment) ([facility] accepts pt to OBS unit)  On Site Evaluation by:   Reviewed with Physician:    Donnamarie Rossetti P 09/16/2013 4:49 PM

## 2013-09-16 NOTE — ED Notes (Signed)
Pt requesting detox from ETOH. Last drink this morning. Pt states he drinks an average of 6-8 drinks/day

## 2013-09-16 NOTE — Plan of Care (Signed)
[facility] Observation Crisis Plan  Reason for Crisis Plan:  Substance Abuse   Plan of Care:  Referral for Substance Abuse  Family Support:    Andon Villard Brother [phone]  Current Living Environment:  Living Arrangements: Alone  Insurance:   Hospital Account   Name Acct ID Class Status Primary Coverage   Aksh, Swart [id number] BEHAVIORAL HEALTH OBSERVATION Open None        Guarantor Account (for Hospital Account [id number])   Name Relation to Pt Service Area Active? Acct Type   Gretta Cool Self [facility] Yes Behavioral Health   Address Phone       [address] [place], [place] [address] [phone](H) [phone])          Coverage Information (for Hospital Account [id number])   Not on file      Legal Guardian:   none  Primary Care Provider:  No PCP Per Patient  Current Outpatient Providers: none per patient  Psychiatrist:   none per patient  Counselor/Therapist:   none per patient  Compliant with Medications:  No medication per patient  Additional Information:   Celene Kras 9/10/20158:37 PM

## 2013-09-16 NOTE — [facility] Counselor (Addendum)
Per Melissa at [facility], they are at capacity.  Writer spoke w/ Eber Jones at [facility] [phone]. She scheduled screening interview for pt on Tues 09/21/13 at 8 am - [address] [address] [address]. Pt will need to bring photo ID, social security and/or bill that proves pt resides in [place].   Evette Cristal, [place] Assessment Counselor

## 2013-09-16 NOTE — Consult Note (Signed)
[facility] Consult   Reason for Consult:  Alcohol abuse Referring Physician:  EDP  Jonathon [name]. Total Time spent with patient: 30 minutes  Assessment: AXIS I:  Alcohol Abuse AXIS II:  Deferred AXIS III:   Past Medical History  Diagnosis Date  . Alcohol abuse   . Anxiety   . Depression   . Hypertension    AXIS IV:  other psychosocial or environmental problems AXIS V:  61-70 mild symptoms  Plan:  24 hour observation  Subjective:   Jonathon Ray is a 43 y.o. male patient admitted with Alcohol abuse and detox.  HPI:  Patient states that he needs assistance with alcohol detox.  Patient states that he was in [facility] and after finished he was sober for 344 days.  "I fell off of the wagon 08/31/2013 and I have been drinking 6-7 glasses of liquor a day.  Patient denies history of seizures.  Patient also denies suicidal/homicidal ideation, psychosis, and paranoia.   HPI Elements:   Location:  alcohol abuse. Quality:  alcohol intoxication. Severity:  alcohol withdrawal. Timing:  several weeks.  Past Psychiatric History: Past Medical History  Diagnosis Date  . Alcohol abuse   . Anxiety   . Depression   . Hypertension     reports that he has been smoking.  He has never used smokeless tobacco. He reports that he drinks about 5 ounces of alcohol per week. He reports that he does not use illicit drugs. No family history on file.   Living Arrangements: Alone Can pt return to current living arrangement?: Yes Abuse/Neglect [facility]) Physical Abuse: Denies Verbal Abuse: Denies Sexual Abuse: Denies Allergies:  No Known Allergies  ACT Assessment Complete:  Yes:    Educational Status    Risk to Self: Risk to self with the past 6 months Suicidal Ideation: No Suicidal Intent: No Is patient at risk for suicide?: No Suicidal Plan?: No Access to Means: No What has been your use of drugs/alcohol within the last 12 months?: daily alcohol use since  08/28/13 Previous Attempts/Gestures: No How many times?: 0 Other Self Harm Risks: none Triggers for Past Attempts:  (n/a) Intentional Self Injurious Behavior: None Family Suicide History: No Recent stressful life event(s): Trauma (Comment);Other (Comment) (recent breakup, recent relapse) Persecutory voices/beliefs?: No Depression: Yes Depression Symptoms: Insomnia;Loss of interest in usual pleasures;Isolating Substance abuse history and/or treatment for substance abuse?: Yes Suicide prevention information given to non-admitted patients: Not applicable  Risk to Others: Risk to Others within the past 6 months Homicidal Ideation: No Thoughts of Harm to Others: No Current Homicidal Intent: No Current Homicidal Plan: No Access to Homicidal Means: No Identified Victim: none History of harm to others?: No Assessment of Violence: None Noted Violent Behavior Description: none Does patient have access to weapons?: No Criminal Charges Pending?: No Does patient have a court date: No  Abuse: Abuse/Neglect Assessment (Assessment to be complete while patient is alone) Physical Abuse: Denies Verbal Abuse: Denies Sexual Abuse: Denies Exploitation of patient/patient's resources: Denies Self-Neglect: Denies  Prior Inpatient Therapy: Prior Inpatient Therapy Prior Inpatient Therapy: Yes Prior Therapy Dates: Sept 2014 Prior Therapy Facilty/Provider(s): [facility] & then [facility] Reason for Treatment: alcohol use disorder  Prior Outpatient Therapy: Prior Outpatient Therapy Prior Outpatient Therapy: No Prior Therapy Dates: na Prior Therapy Facilty/Provider(s): na Reason for Treatment: na  Additional Information: Additional Information 1:1 In Past 12 Months?: No CIRT Risk: No Elopement Risk: No Does patient have medical clearance?: Yes  Objective: Blood pressure 142/96, pulse 110, temperature 99 F (37.2 C), temperature source Oral, resp. rate 18, SpO2  99.00%.There is no weight on file to calculate BMI. Results for orders placed during the hospital encounter of 09/16/13 (from the past 72 hour(s))  URINE RAPID DRUG SCREEN (HOSP PERFORMED)     Status: None   Collection Time    09/16/13 12:16 PM      Result Value Ref Range   Opiates NONE DETECTED  NONE DETECTED   Cocaine NONE DETECTED  NONE DETECTED   Benzodiazepines NONE DETECTED  NONE DETECTED   Amphetamines NONE DETECTED  NONE DETECTED   Tetrahydrocannabinol NONE DETECTED  NONE DETECTED   Barbiturates NONE DETECTED  NONE DETECTED   Comment:            DRUG SCREEN FOR MEDICAL PURPOSES     ONLY.  IF CONFIRMATION IS NEEDED     FOR ANY PURPOSE, NOTIFY LAB     WITHIN 5 DAYS.                LOWEST DETECTABLE LIMITS     FOR URINE DRUG SCREEN     Drug Class       Cutoff (ng/mL)     Amphetamine      1000     Barbiturate      200     Benzodiazepine   009     Tricyclics       381     Opiates          300     Cocaine          300     THC              50  ACETAMINOPHEN LEVEL     Status: None   Collection Time    09/16/13 12:25 PM      Result Value Ref Range   Acetaminophen (Tylenol), Serum <15.0  10 - 30 ug/mL   Comment:            THERAPEUTIC CONCENTRATIONS VARY     SIGNIFICANTLY. A RANGE OF 10-30     ug/mL MAY BE AN EFFECTIVE     CONCENTRATION FOR MANY PATIENTS.     HOWEVER, SOME ARE BEST TREATED     AT CONCENTRATIONS OUTSIDE THIS     RANGE.     ACETAMINOPHEN CONCENTRATIONS     >150 ug/mL AT 4 HOURS AFTER     INGESTION AND >50 ug/mL AT 12     HOURS AFTER INGESTION ARE     OFTEN ASSOCIATED WITH TOXIC     REACTIONS.  CBC     Status: Abnormal   Collection Time    09/16/13 12:25 PM      Result Value Ref Range   WBC 9.1  4.0 - 10.5 K/uL   RBC 5.37  4.22 - 5.81 MIL/uL   Hemoglobin 17.9 (*) 13.0 - 17.0 g/dL   HCT 49.2  39.0 - 52.0 %   MCV 91.6  78.0 - 100.0 fL   MCH 33.3  26.0 - 34.0 pg   MCHC 36.4 (*) 30.0 - 36.0 g/dL   RDW 14.7  11.5 - 15.5 %   Platelets 230  150 - 400  K/uL  COMPREHENSIVE METABOLIC PANEL     Status: Abnormal   Collection Time    09/16/13 12:25 PM      Result Value Ref Range   Sodium 141  137 - 147 mEq/L   Potassium  3.9  3.7 - 5.3 mEq/L   Chloride 100  96 - 112 mEq/L   CO2 22  19 - 32 mEq/L   Glucose, Bld 128 (*) 70 - 99 mg/dL   BUN 7  6 - 23 mg/dL   Creatinine, Ser 0.67  0.50 - 1.35 mg/dL   Calcium 9.2  8.4 - 10.5 mg/dL   Total Protein 7.3  6.0 - 8.3 g/dL   Albumin 4.2  3.5 - 5.2 g/dL   AST 29  0 - 37 U/L   ALT 25  0 - 53 U/L   Alkaline Phosphatase 81  39 - 117 U/L   Total Bilirubin 0.5  0.3 - 1.2 mg/dL   GFR calc non Af Amer >90  >90 mL/min   GFR calc Af Amer >90  >90 mL/min   Comment: (NOTE)     The eGFR has been calculated using the CKD EPI equation.     This calculation has not been validated in all clinical situations.     eGFR's persistently <90 mL/min signify possible Chronic Kidney     Disease.   Anion gap 19 (*) 5 - 15  ETHANOL     Status: Abnormal   Collection Time    09/16/13 12:25 PM      Result Value Ref Range   Alcohol, Ethyl (B) 254 (*) 0 - 11 mg/dL   Comment:            LOWEST DETECTABLE LIMIT FOR     SERUM ALCOHOL IS 11 mg/dL     FOR MEDICAL PURPOSES ONLY  SALICYLATE LEVEL     Status: Abnormal   Collection Time    09/16/13 12:25 PM      Result Value Ref Range   Salicylate Lvl <4.2 (*) 2.8 - 20.0 mg/dL   Labs are review see above values.  Medications reviewed and Librium started for alcohol detox.    Current Facility-Administered Medications  Medication Dose Route Frequency Provider Last Rate Last Dose  . acetaminophen (TYLENOL) tablet 650 mg  650 mg Oral Q4H PRN Courtney A Forcucci, PA-C      . ibuprofen (ADVIL,MOTRIN) tablet 600 mg  600 mg Oral Q8H PRN Courtney A Forcucci, PA-C      . LORazepam (ATIVAN) tablet 0-4 mg  0-4 mg Oral 4 times per day Courtney A Forcucci, PA-C       Followed by  . [START ON 09/18/2013] LORazepam (ATIVAN) tablet 0-4 mg  0-4 mg Oral Q12H Courtney A Forcucci, PA-C      .  nicotine (NICODERM CQ - dosed in mg/24 hours) patch 21 mg  21 mg Transdermal Daily Courtney A Forcucci, PA-C   21 mg at 09/16/13 1508  . ondansetron (ZOFRAN) tablet 4 mg  4 mg Oral Q8H PRN Courtney A Forcucci, PA-C      . thiamine (VITAMIN B-1) tablet 100 mg  100 mg Oral Daily Courtney A Forcucci, PA-C   100 mg at 09/16/13 1507   Or  . thiamine (B-1) injection 100 mg  100 mg Intravenous Daily Courtney A Forcucci, PA-C       Current Outpatient Prescriptions  Medication Sig Dispense Refill  . LORazepam (ATIVAN PO) Take 1 tablet by mouth once.      . Multiple Vitamins-Minerals (MULTIVITAMIN WITH MINERALS) tablet Take 1 tablet by mouth daily.      . ondansetron (ZOFRAN) 4 MG tablet Take 1 tablet (4 mg total) by mouth every 6 (six) hours.  12 tablet  0  .  oxyCODONE-acetaminophen (PERCOCET/ROXICET) 5-325 MG per tablet Take 1 tablet by mouth every 4 (four) hours as needed for severe pain.  10 tablet  0    Psychiatric Specialty Exam:     Blood pressure 142/96, pulse 110, temperature 99 F (37.2 C), temperature source Oral, resp. rate 18, SpO2 99.00%.There is no weight on file to calculate BMI.  General Appearance: Casual  Eye Contact::  Good  Speech:  Clear and Coherent  Volume:  Normal  Mood:  Depressed  Affect:  Congruent  Thought Process:  Circumstantial and Goal Directed  Orientation:  Full (Time, Place, and Person)  Thought Content:  "I want help with detox"  Suicidal Thoughts:  No  Homicidal Thoughts:  No  Memory:  Immediate;   Good Recent;   Good Remote;   Good  Judgement:  Fair  Insight:  Fair  Psychomotor Activity:  Normal  Concentration:  Fair  Recall:  Good  Fund of Knowledge:Good  Language: Good  Akathisia:  No  Handed:  Right  AIMS (if indicated):     Assets:  Communication Skills Desire for Improvement Housing Social Support  Sleep:      Musculoskeletal: Strength & Muscle Tone: within normal limits Gait & Station: normal Patient leans: N/A  Treatment Plan  Summary: 24 hour observantion and Librium Protocol for detox  Patient accepted to [facility] Observation Unit  Earleen Newport, FNP-BC 09/16/2013 4:44 PM

## 2013-09-16 NOTE — ED Provider Notes (Signed)
Medical screening examination/treatment/procedure(s) were performed by non-physician practitioner and as supervising physician I was immediately available for consultation/collaboration.   EKG Interpretation None        Toy Cookey, MD 09/16/13 2039

## 2013-09-16 NOTE — Progress Notes (Signed)
[facility] INPATIENT:  Family/Significant Other Suicide Prevention Education  Suicide Prevention Education:  Patient Refusal for Family/Significant Other Suicide Prevention Education: The patient Jonathon Ray has refused to provide written consent for family/significant other to be provided Family/Significant Other Suicide Prevention Education during admission and/or prior to discharge.    Celene Kras 09/16/2013, 8:40 PM

## 2013-09-16 NOTE — Progress Notes (Signed)
Pt admitted to Observation unit for Alcohol dependence. Pt reports relapsed 08/28/13 after 344 days sober. Pt states after break up with girlfriend he returned to alcohol. Denies SI/HI, -a/vhall, verbally contracts for safety. Pt affect/mood depressed. C/o insomnia, increased anxiety, decreased appetite, depression and headache see Mar. Chart reports hx of HTN, pt denies hx of antihypertensive medications. Will monitor closely, give medication as ordered by physician and provide emotional support as needed.

## 2013-09-16 NOTE — Progress Notes (Signed)
[facility] Community Liaison Stacy,  ° °Provided pt with a [facility] Orange Card application, highlighting [facility], to help patient establish primary care.  °

## 2013-09-16 NOTE — ED Provider Notes (Signed)
CSN: [id number]     Arrival date & time 09/16/13  1156 History  This chart was scribed for non-physician practitioner working with Toy Cookey, MD by Elveria Rising, ED Scribe. This patient was seen in room [facility] and the patient's care was started at 1:20 PM.   Chief Complaint  Patient presents with  . detox     The history is provided by the patient. No language interpreter was used.   HPI Comments: Jonathon Ray is a 43 y.o. male who presents to the Emergency Department requesting alcohol detoxification after a relapse three weeks ago. Patient reports 344 days of sobriety. Patient reports that his last drink was this morning. On average he drinks 6-8 alcoholic beverages a day: he prefers double shots of vodka. Patient attributes his relapse to his girlfriend leaving him. Patient denies suicidal or homicidal ideation, audio or visual hallucinations. He denies seizures or delirium tremors withdrawal symptoms with previous detoxification. Patient states that he would like to complete his detox at [facility], a facility he has been in contact with. He, however, is uncertain if there is a bed available for him there.  Patient denies current medical complaints. Patient denies chronic medical issues, but his PMHx shows history of Hypertension.  Patient is an admitted smoker. He denies illicit drug use.   A complete 10 system review of systems was obtained and all systems are negative except as noted in the HPI and PMH.   Past Medical History  Diagnosis Date  . Alcohol abuse   . Anxiety   . Depression   . Hypertension    Past Surgical History  Procedure Laterality Date  . Finger surgery     No family history on file. History  Substance Use Topics  . Smoking status: Current Every Day Smoker -- 1.00 packs/day  . Smokeless tobacco: Never Used  . Alcohol Use: No     Comment: 3 drinks of vodka a day    Review of Systems See HPI   Allergies  Review of patient's allergies indicates  no known allergies.  Home Medications   Prior to Admission medications   Medication Sig Start Date End Date Taking? Authorizing Provider  LORazepam (ATIVAN PO) Take 1 tablet by mouth once.   Yes Historical Provider, MD  Multiple Vitamins-Minerals (MULTIVITAMIN WITH MINERALS) tablet Take 1 tablet by mouth daily.   Yes Historical Provider, MD  ondansetron (ZOFRAN) 4 MG tablet Take 1 tablet (4 mg total) by mouth every 6 (six) hours. 09/03/13   Raeford Razor, MD  oxyCODONE-acetaminophen (PERCOCET/ROXICET) 5-325 MG per tablet Take 1 tablet by mouth every 4 (four) hours as needed for severe pain. 09/03/13   Raeford Razor, MD   Triage Vitals: BP 145/91  Pulse 120  Temp(Src) 98.6 F (37 C) (Oral)  Resp 20  SpO2 96%  Physical Exam  Nursing note and vitals reviewed. Constitutional: He is oriented to person, place, and time. He appears well-developed and well-nourished. No distress.  HENT:  Head: Normocephalic and atraumatic.  Mouth/Throat: Oropharynx is clear and moist. No oropharyngeal exudate.  Eyes: Conjunctivae and EOM are normal. Pupils are equal, round, and reactive to light. No scleral icterus.  Neck: Normal range of motion. Neck supple. No JVD present. No thyromegaly present.  Cardiovascular: Normal rate, regular rhythm, normal heart sounds and intact distal pulses.  Exam reveals no gallop and no friction rub.   No murmur heard. Pulmonary/Chest: Effort normal and breath sounds normal. No respiratory distress. He has no wheezes. He has no  rales. He exhibits no tenderness.  Abdominal: Soft. Bowel sounds are normal. He exhibits no distension and no mass. There is no tenderness. There is no rebound and no guarding.  Musculoskeletal: Normal range of motion.  Lymphadenopathy:    He has no cervical adenopathy.  Neurological: He is alert and oriented to person, place, and time.  Skin: Skin is warm and dry. He is not diaphoretic.  Psychiatric: He has a normal mood and affect. His behavior is  normal. Judgment and thought content normal.    ED Course  Procedures (including critical care time)  COORDINATION OF CARE: 1:29 PM- Discussed treatment plan with patient at bedside and patient agreed to plan.   Labs Review Labs Reviewed  CBC - Abnormal; Notable for the following:    Hemoglobin 17.9 (*)    MCHC 36.4 (*)    All other components within normal limits  COMPREHENSIVE METABOLIC PANEL - Abnormal; Notable for the following:    Glucose, Bld 128 (*)    Anion gap 19 (*)    All other components within normal limits  ETHANOL - Abnormal; Notable for the following:    Alcohol, Ethyl (B) 254 (*)    All other components within normal limits  SALICYLATE LEVEL - Abnormal; Notable for the following:    Salicylate Lvl <2.0 (*)    All other components within normal limits  ACETAMINOPHEN LEVEL  URINE RAPID DRUG SCREEN (HOSP PERFORMED)    Imaging Review No results found.   EKG Interpretation None      MDM   Final diagnoses:  Alcohol dependence with uncomplicated intoxication   Patient is a 43 y.o. Male who presents to the ED with desire to detox from alcohol.  Physical exam is unremarkable.  Basic labs were drawn here and reveals increased hemoglobin which is likely due to hemoconcentration.  Patient is currently intoxicated.  Patient medically cleared.  Will place him in a psych hold at this time.  Patient to be seen by TTS.  Awaiting psych input.    I personally performed the services described in this documentation, which was scribed in my presence. The recorded information has been reviewed and is accurate.    Eben Burow, PA-C 09/16/13 [phone]

## 2013-09-17 ENCOUNTER — Observation Stay (HOSPITAL_COMMUNITY): Payer: Self-pay

## 2013-09-17 ENCOUNTER — Encounter (HOSPITAL_COMMUNITY): Payer: Self-pay | Admitting: Emergency Medicine

## 2013-09-17 LAB — CBC
HCT: 50.1 % (ref 39.0–52.0)
HEMOGLOBIN: 18 g/dL — AB (ref 13.0–17.0)
MCH: 34 pg (ref 26.0–34.0)
MCHC: 35.9 g/dL (ref 30.0–36.0)
MCV: 94.7 fL (ref 78.0–100.0)
Platelets: 198 10*3/uL (ref 150–400)
RBC: 5.29 MIL/uL (ref 4.22–5.81)
RDW: 14.9 % (ref 11.5–15.5)
WBC: 5.7 10*3/uL (ref 4.0–10.5)

## 2013-09-17 LAB — BASIC METABOLIC PANEL
Anion gap: 17 — ABNORMAL HIGH (ref 5–15)
BUN: 13 mg/dL (ref 6–23)
CHLORIDE: 100 meq/L (ref 96–112)
CO2: 21 mEq/L (ref 19–32)
CREATININE: 0.77 mg/dL (ref 0.50–1.35)
Calcium: 9.5 mg/dL (ref 8.4–10.5)
GFR calc non Af Amer: >90 mL/min (ref >90)
GLUCOSE: 92 mg/dL (ref 70–99)
POTASSIUM: 4 meq/L (ref 3.7–5.3)
Sodium: 138 mEq/L (ref 137–147)

## 2013-09-17 LAB — POCT I-STAT TROPONIN I: Troponin i, poc: 0.01 ng/mL (ref 0.00–0.08)

## 2013-09-17 LAB — TROPONIN I

## 2013-09-17 MED ORDER — LORAZEPAM 2 MG/ML IJ SOLN
1.0000 mg | Freq: Once | INTRAMUSCULAR | Status: AC
  Administered 2013-09-17: 1 mg via INTRAVENOUS

## 2013-09-17 NOTE — Progress Notes (Signed)
Patient ID: Jonathon Ray, male   DOB: 04/01/1970, 43 y.o.   MRN: [id number] Pt interacting appropriately with staff.  Needs assessed.  Pt denied.  Fifteen minute checks in progress for patient safety.  Pt safe on unit.

## 2013-09-17 NOTE — Progress Notes (Signed)
Patient ID: Jonathon Ray, male   DOB: [birth year], 43 y.o.   MRN: [id number] Woke up from a nap at 915 still complaining of a dull headache even after having Ibuprofen and Tylenol.and still complaining of mild chest discomfort. He had his Librium for his detox protocol at 0745. Chart says history of hypertension but he states he has never been treated for HTN before, but he also doesn't have a medical dr or recent medical care. Contacted NP today with recent vitals of 178/124 and p=102 and he ordered him to ED for evaluation. Sending him via [facility] to the ED. Spoke with [facility] to inform of plans.He denies any radiating pain, not nauseas and states has had chest pain on and off throughout the night.

## 2013-09-17 NOTE — Progress Notes (Signed)
D:Pt brought back to the observation unit from the [facility] ED. Pt reports sobriety for 364 days. He rates generalized pain as a 5 on 1-10 scale with 10 being the most. Pt denies si and hi.A:Offered emotional support and scheduled librium. R:Safety maintained in the observation unit.

## 2013-09-17 NOTE — ED Notes (Signed)
Pt is an observation pt from [facility].  Pt c/o chest pain and HTN this am.  BP elevated through the night and increased at am check.  Chest pain comes and goes and is in center of chest.  Pt does not take bp meds.  Is there for detox from alcohol.  Last drink yesterday.

## 2013-09-17 NOTE — ED Provider Notes (Signed)
CSN: [id number]     Arrival date & time 09/17/13  0945 History   First MD Initiated Contact with Patient 09/17/13 1000     Chief Complaint  Patient presents with  . Hypertension  . Chest Pain     (Consider location/radiation/quality/duration/timing/severity/associated sxs/prior Treatment) Patient is a 43 y.o. male presenting with chest pain.  Chest Pain Pain location:  Substernal area Pain quality: sharp   Pain radiates to:  Does not radiate Pain severity:  Mild Onset quality:  Gradual Duration: today. Timing:  Constant Progression:  Unchanged Chronicity:  New Context comment:  In detox program from [facility].   Relieved by:  Nothing Ineffective treatments: tylenol, ibuprofen.  Associated symptoms: shortness of breath ("a little") and vomiting   Associated symptoms: no abdominal pain and no fever   Associated symptoms comment:  Anxiousness   Past Medical History  Diagnosis Date  . Alcohol abuse   . Anxiety   . Depression   . Hypertension    Past Surgical History  Procedure Laterality Date  . Finger surgery     History reviewed. No pertinent family history. History  Substance Use Topics  . Smoking status: Current Every Day Smoker -- 1.00 packs/day  . Smokeless tobacco: Never Used  . Alcohol Use: 5.0 oz/week    10 drink(s) per week     Comment: 3 drinks of vodka a day    Review of Systems  Constitutional: Negative for fever.  Respiratory: Positive for shortness of breath ("a little").   Cardiovascular: Positive for chest pain.  Gastrointestinal: Positive for vomiting. Negative for abdominal pain.  All other systems reviewed and are negative.     Allergies  Review of patient's allergies indicates no known allergies.  Home Medications   Prior to Admission medications   Medication Sig Start Date End Date Taking? Authorizing Provider  LORazepam (ATIVAN PO) Take 1 tablet by mouth once.   Yes Historical Provider, MD  Multiple Vitamins-Minerals (MULTIVITAMIN  WITH MINERALS) tablet Take 1 tablet by mouth daily.   Yes Historical Provider, MD  ondansetron (ZOFRAN) 4 MG tablet Take 1 tablet (4 mg total) by mouth every 6 (six) hours. 09/03/13   Raeford Razor, MD  oxyCODONE-acetaminophen (PERCOCET/ROXICET) 5-325 MG per tablet Take 1 tablet by mouth every 4 (four) hours as needed for severe pain. 09/03/13   Raeford Razor, MD   BP 159/117  Pulse 116  Temp(Src) 98.5 F (36.9 C) (Oral)  Resp 20  Ht  (1.727 m)  Wt 240 lb (108.863 kg)  BMI 36.50 kg/m2  SpO2 99% Physical Exam  Nursing note and vitals reviewed. Constitutional: He is oriented to person, place, and time. He appears well-developed and well-nourished. No distress.  HENT:  Head: Normocephalic and atraumatic.  Mouth/Throat: Oropharynx is clear and moist.  Eyes: Conjunctivae are normal. Pupils are equal, round, and reactive to light. No scleral icterus.  Neck: Neck supple.  Cardiovascular: Regular rhythm, normal heart sounds and intact distal pulses.  Tachycardia present.   No murmur heard. Pulmonary/Chest: Effort normal and breath sounds normal. No stridor. Not tachypneic. No respiratory distress. He has no wheezes. He has no rales.  Abdominal: Soft. He exhibits no distension. There is no tenderness.  Musculoskeletal: Normal range of motion. He exhibits no edema.  Neurological: He is alert and oriented to person, place, and time.  Mild tremulousness  Skin: Skin is warm and dry. No rash noted.  Psychiatric: His behavior is normal. His mood appears anxious.    ED Course  Procedures (  including critical care time) Labs Review Labs Reviewed  BASIC METABOLIC PANEL - Abnormal; Notable for the following:    Anion gap 17 (*)    All other components within normal limits  CBC - Abnormal; Notable for the following:    Hemoglobin 18.0 (*)    All other components within normal limits  TROPONIN I  I-STAT TROPOININ, ED  POCT I-STAT TROPONIN I    Imaging Review Dg Chest 2 View  09/17/2013    CLINICAL DATA:  Mid chest pain  EXAM: CHEST  2 VIEW  COMPARISON:  09/03/2013  FINDINGS: The heart size and mediastinal contours are within normal limits. Both lungs are clear. The visualized skeletal structures are unremarkable.  IMPRESSION: No active cardiopulmonary disease.   Electronically Signed   By: Herbie Baltimore M.D.   On: 09/17/2013 12:12  All radiology studies independently viewed by me.      EKG Interpretation   Date/Time:  Friday September 17 2013 10:02:17 EDT Ventricular Rate:  117 PR Interval:  140 QRS Duration: 86 QT Interval:  317 QTC Calculation: 442 R Axis:   83 Text Interpretation:  Sinus tachycardia `compared to prior, rate increased   Confirmed by [facility]  MD, TREY ([id number]) on 09/17/2013 12:05:40 PM      MDM   Final diagnoses:  Alcohol withdrawal, uncomplicated    43 yo male presenting from the behavioral health observation unit secondary to hypertension, tachycardia, and chest pain in the setting of alcohol withdrawal and detox. I suspect his chest pain and vital signs abnormalities are secondary to his alcohol withdrawal. Will obtain chest x-ray, lab work to evaluate for a possible etiologies. However, I think ACS, PE, pneumonia, Boerhaave syndrome are all very unlikely in this setting.  Two troponins were negative.  Symptoms and vital signs improved with IV ativan.  I suspect alcohol withdrawal as the source of his symptoms and vital sign abnormalities.  He appears stable for transfer back to [facility] for continued detox.   [name], MD 09/17/13 [phone]

## 2013-09-17 NOTE — Progress Notes (Signed)
Patient ID: Jonathon Ray, male   DOB: January 03, 1971, 43 y.o.   MRN: [id number] Contacted by [facility] ursing reporting pt BP 174/125 with c/o chest pain that began last nite.Pt currently on ETOH w/d protocol.Needs cardiac assessment Ordered to ED for same

## 2013-09-18 NOTE — Progress Notes (Signed)
Patient seen lying in bed, eyes closed, respiration even. Patient requested for sleeping pills stated "I can't sleep". Trazodone 50 mg given po prn for insomnia. Effective - patient snoring.

## 2013-09-18 NOTE — Progress Notes (Signed)
Nursing Shift Note denies SI, HI, AVH. General pain #5. Pt reports poor appetite, poor sleep, drinks 8-10 double shots of vodka, been sober for the past 344 days until break up with girlfriend. Pleasant mood, flat expression, cooperative behavior. Has an appointment on Tuesday with [facility] for an assessment. Lives alone in [place]. Healthy coping skills packet given. Support and encouragement given. Continue to monitor and stabilize.

## 2013-09-18 NOTE — Progress Notes (Signed)
Met patient awake and alert. oriented X 3. Patient denied pain, SI, AH/VH. No new complain. Pt stated that he had chest pain earlier but ok now. Reports feeling [place] all the time. Snacks and fluid offered. Patient also complained of insomnia and requested for "something to help him sleep". Trazodone 50 mg given prn for insomnia. Patient offered support and encouragement. Every 15 minutes check for safety. Will continue to monitor patient

## 2013-09-18 NOTE — Consult Note (Signed)
[facility] Consult   Reason for Consult:  Alcohol dependence Referring Physician:  EDP Jonathon [name]. Total Time spent with patient: 1 hour  Assessment: AXIS I:  Alcohol dependence, severe AXIS II:  Deferred AXIS III:   Past Medical History  Diagnosis Date  . Alcohol abuse   . Anxiety   . Depression   . Hypertension    AXIS IV:  occupational problems, other psychosocial or environmental problems and problems related to social environment AXIS V:  61-70 mild symptoms  Plan:  No evidence of imminent risk to self or others at present.   Discussed crisis plan, support from social network, calling 911, coming to the Emergency Department, and calling Suicide Hotline. Patient is interested in receiving treatment at [facility] and was treated thjere last year.  Subjective:   Jonathon Ray is a 43 y.o. male patient admitted with Alcohol intoxication/depenedence.  HPI:  Patient was admitted for observation in our observation unit for alcohol dependence and intoxication.  Patient was sober for 11 months but relapsed in August when his girl friend suddenly left him.  Patient states he did not know how to deal with the breakup than relapsing on Alcohol. He reports drinking 7-8 shots of Vodka daily.  Patient was [facility] last [facility] at [facility] for alcoholism and plans to go back there for rehabilitation.  Patient denies any mental illness diagnosis.  Patient denies  SI/HI/AVH and he does not have hx of previous SA .  Patient started drinking alcohol at age 4 and have been drinking off and on.  He has mild tremors of his fingers at this time and he denies hx of withdrawal seizures. Patient is interested in receiving treatment at [facility].  He has reported chest pain early this am and was sent to the ER for assessment.  Based on the complaint of chest pain patient will be kept overnight for closer monitoring.  We will reevaluate again and determine appropriate  disposition.  We are using our Librium protocol for his detox.  HPI Elements:   Location:  Alcohol intoxication/Dependence. Quality:  moderate, tremors of his fingers, tired and sleepy from taking Librium, angry and disappointed for rwelapsing.. Severity:  moderate. Context:  Breaking up with girl friend, started using alcohol after 11 months of sobriety.  Past Psychiatric History: Past Medical History  Diagnosis Date  . Alcohol abuse   . Anxiety   . Depression   . Hypertension     reports that he has been smoking.  He has never used smokeless tobacco. He reports that he drinks about 5 ounces of alcohol per week. He reports that he does not use illicit drugs. History reviewed. No pertinent family history.   Living Arrangements: Alone   Abuse/Neglect [facility]) Physical Abuse: Denies Verbal Abuse: Denies Sexual Abuse: Denies Allergies:  No Known Allergies  ACT Assessment Complete:  Yes:    Educational Status    Risk to Self: Risk to self with the past 6 months Is patient at risk for suicide?: No Substance abuse history and/or treatment for substance abuse?: Yes  Risk to Others:    Abuse: Abuse/Neglect Assessment (Assessment to be complete while patient is alone) Physical Abuse: Denies Verbal Abuse: Denies Sexual Abuse: Denies Exploitation of patient/patient's resources: Denies Self-Neglect: Denies  Prior Inpatient Therapy:    Prior Outpatient Therapy:    Additional Information:      Objective: Blood pressure 126/92, pulse 77, temperature 97.9 F (36.6 C), temperature  source Oral, resp. rate 18, height _0  (1.727 m), weight 108.863 kg (240 lb), SpO2 99.00%.Body mass index is 36.5 kg/(m^2). Results for orders placed during the hospital encounter of 09/16/13 (from the past 72 hour(s))  BASIC METABOLIC PANEL     Status: Abnormal   Collection Time    09/17/13 11:00 AM      Result Value Ref Range   Sodium 138  137 - 147 mEq/L   Potassium 4.0  3.7 - 5.3 mEq/L   Chloride 100   96 - 112 mEq/L   CO2 21  19 - 32 mEq/L   Glucose, Bld 92  70 - 99 mg/dL   BUN 13  6 - 23 mg/dL   Creatinine, Ser 0.77  0.50 - 1.35 mg/dL   Calcium 9.5  8.4 - 10.5 mg/dL   GFR calc non Af Amer >90  >90 mL/min   GFR calc Af Amer >90  >90 mL/min   Comment: (NOTE)     The eGFR has been calculated using the CKD EPI equation.     This calculation has not been validated in all clinical situations.     eGFR's persistently <90 mL/min signify possible Chronic Kidney     Disease.   Anion gap 17 (*) 5 - 15  TROPONIN I     Status: None   Collection Time    09/17/13 11:12 AM      Result Value Ref Range   Troponin I <0.30  <0.30 ng/mL   Comment:            Due to the release kinetics of cTnI,     a negative result within the first hours     of the onset of symptoms does not rule out     myocardial infarction with certainty.     If myocardial infarction is still suspected,     repeat the test at appropriate intervals.  CBC     Status: Abnormal   Collection Time    09/17/13 11:13 AM      Result Value Ref Range   WBC 5.7  4.0 - 10.5 K/uL   RBC 5.29  4.22 - 5.81 MIL/uL   Hemoglobin 18.0 (*) 13.0 - 17.0 g/dL   HCT 50.1  39.0 - 52.0 %   MCV 94.7  78.0 - 100.0 fL   MCH 34.0  26.0 - 34.0 pg   MCHC 35.9  30.0 - 36.0 g/dL   RDW 14.9  11.5 - 15.5 %   Platelets 198  150 - 400 K/uL  POCT I-STAT TROPONIN I     Status: None   Collection Time    09/17/13 11:15 AM      Result Value Ref Range   Troponin i, poc 0.01  0.00 - 0.08 ng/mL   Comment 3            Comment: Due to the release kinetics of cTnI,     a negative result within the first hours     of the onset of symptoms does not rule out     myocardial infarction with certainty.     If myocardial infarction is still suspected,     repeat the test at appropriate intervals.   Labs are reviewed and are pertinent for Alcohol level of 254 on arrival on the 10 th of September.  Current Facility-Administered Medications  Medication Dose Route  Frequency Provider Last Rate Last Dose  . acetaminophen (TYLENOL) tablet 650 mg  650 mg Oral  Q4H PRN Shuvon Rankin, NP   650 mg at 09/17/13 0729  . alum & mag hydroxide-simeth (MAALOX/MYLANTA) 200-200-20 MG/5ML suspension 30 mL  30 mL Oral Q4H PRN Shuvon Rankin, NP      . chlordiazePOXIDE (LIBRIUM) capsule 25 mg  25 mg Oral Q6H PRN Shuvon Rankin, NP      . chlordiazePOXIDE (LIBRIUM) capsule 25 mg  25 mg Oral Once Shuvon Rankin, NP      . chlordiazePOXIDE (LIBRIUM) capsule 25 mg  25 mg Oral TID Shuvon Rankin, NP   25 mg at 09/18/13 0804   Followed by  . [START ON 09/19/2013] chlordiazePOXIDE (LIBRIUM) capsule 25 mg  25 mg Oral BH-qamhs Shuvon Rankin, NP       Followed by  . [START ON 09/20/2013] chlordiazePOXIDE (LIBRIUM) capsule 25 mg  25 mg Oral Daily Shuvon Rankin, NP      . hydrOXYzine (ATARAX/VISTARIL) tablet 25 mg  25 mg Oral Q6H PRN Shuvon Rankin, NP   25 mg at 09/17/13 0615  . ibuprofen (ADVIL,MOTRIN) tablet 600 mg  600 mg Oral Q8H PRN Shuvon Rankin, NP   600 mg at 09/17/13 1322  . loperamide (IMODIUM) capsule 2-4 mg  2-4 mg Oral PRN Shuvon Rankin, NP      . magnesium hydroxide (MILK OF MAGNESIA) suspension 30 mL  30 mL Oral Daily PRN Shuvon Rankin, NP      . nicotine (NICODERM CQ - dosed in mg/24 hours) patch 21 mg  21 mg Transdermal Daily Shuvon Rankin, NP   21 mg at 09/18/13 0802  . ondansetron (ZOFRAN) tablet 4 mg  4 mg Oral Q8H PRN Shuvon Rankin, NP      . ondansetron (ZOFRAN-ODT) disintegrating tablet 4 mg  4 mg Oral Q6H PRN Shuvon Rankin, NP      . thiamine (VITAMIN B-1) tablet 100 mg  100 mg Oral Daily Shuvon Rankin, NP   100 mg at 09/18/13 0805   Or  . thiamine (B-1) injection 100 mg  100 mg Intravenous Daily Shuvon Rankin, NP      . traZODone (DESYREL) tablet 50 mg  50 mg Oral QHS PRN Neita Garnet, MD   50 mg at 09/17/13 2348    Psychiatric Specialty Exam:     Blood pressure 126/92, pulse 77, temperature 97.9 F (36.6 C), temperature source Oral, resp. rate 18, height 5'  8" (1.727 m), weight 108.863 kg (240 lb), SpO2 99.00%.Body mass index is 36.5 kg/(m^2).  General Appearance: Casual  Eye Contact::  Good  Speech:  Clear and Coherent and Normal Rate  Volume:  Normal  Mood:  Depressed  Affect:  Depressed and Flat  Thought Process:  Coherent, Goal Directed and Intact  Orientation:  Full (Time, Place, and Person)  Thought Content:  WDL  Suicidal Thoughts:  No  Homicidal Thoughts:  No  Memory:  Immediate;   Good Recent;   Good Remote;   Good  Judgement:  Fair  Insight:  Good  Psychomotor Activity:  Tremor  Concentration:  Good  Recall:  NA  Fund of Knowledge:Good  Language: Good  Akathisia:  NA  Handed:  Right  AIMS (if indicated):     Assets:  Desire for Improvement  Sleep:      Musculoskeletal: Strength & Muscle Tone: within normal limits Gait & Station: normal Patient leans: N/A  Treatment Plan Summary:  Patient will be monitored for one more day overnight per Dr De Nurse.  We will reevaluate in am and determine appropriate disposition. Patient will be discharged  home to follow up with [facility] for Rehabilitation treatment  Delfin Gant   PMHNP-BC 09/18/2013 9:58 AM Agree with plan.

## 2013-09-19 DIAGNOSIS — F1020 Alcohol dependence, uncomplicated: Secondary | ICD-10-CM

## 2013-09-19 DIAGNOSIS — F10239 Alcohol dependence with withdrawal, unspecified: Secondary | ICD-10-CM

## 2013-09-19 DIAGNOSIS — F10939 Alcohol use, unspecified with withdrawal, unspecified: Secondary | ICD-10-CM

## 2013-09-19 NOTE — Discharge Summary (Signed)
Physician Discharge Summary Note  Patient:  Jonathon Ray is an 43 y.o., male MRN:  [id number] DOB:  Apr 26, 1970 Patient phone:  [phone] (home)  Patient address:   [place] [address],  Total Time spent with patient: 30 minutes  Date of Admission:  09/16/2013 Date of Discharge: 09/19/2013  Reason for Admission:  Alcohol dependence, withdrawal, intoxication.  Discharge Diagnoses: Active Problems:   S/P alcohol detoxification   Psychiatric Specialty Exam: Physical Exam  ROS  Blood pressure 118/83, pulse 72, temperature 97.8 F (36.6 C), temperature source Oral, resp. rate 16, height _0  (1.727 m), weight 108.863 kg (240 lb), SpO2 99.00%.Body mass index is 36.5 kg/(m^2).  General Appearance: Casual  Eye Contact::  Good  Speech:  Clear and Coherent and Normal Rate  Volume:  Normal  Mood:  Anxious  Affect:  Congruent  Thought Process:  Coherent, Goal Directed and Intact  Orientation:  Full (Time, Place, and Person)  Thought Content:  WDL  Suicidal Thoughts:  No  Homicidal Thoughts:  No  Memory:  Immediate;   Good Recent;   Good Remote;   Good  Judgement:  Fair  Insight:  Good  Psychomotor Activity:  Normal  Concentration:  Good  Recall:  NA  Fund of Knowledge:Good  Language: Good  Akathisia:  NA  Handed:  Right  AIMS (if indicated):     Assets:  Desire for Improvement  Sleep:       Past Psychiatric History: Diagnosis:  Hospitalizations:  Outpatient Care:  Substance Abuse Care:  Self-Mutilation:  Suicidal Attempts:  Violent Behaviors:   Musculoskeletal: Strength & Muscle Tone: within normal limits Gait & Station: normal Patient leans: N/A  DSM5:  Schizophrenia Disorders:   Obsessive-Compulsive Disorders:   Trauma-Stressor Disorders:   Substance/Addictive Disorders:  Alcohol Related Disorder - Severe (303.90) Depressive Disorders:    Axis Diagnosis:   AXIS I:  Alcohol dependence AXIS II:  Deferred AXIS III:   Past Medical  History  Diagnosis Date  . Alcohol abuse   . Anxiety   . Depression   . Hypertension    AXIS IV:  other psychosocial or environmental problems and problems related to social environment AXIS V:  61-70 mild symptoms  Level of Care:  OP  Hospital Course:  Patient was admitted to our observation unit for alcohol dependence and withdrawal symptoms.  Patient reports he relapsed  After 11 months of sobriety because his girl friend ended their relationship abruptly.  Patient started drinking again heavily since August and now have decided to seek help .  Patient was detoxed safely with our Librium protocol.  He had chest pain that was evaluated at [facility].  Patient is now ready to go home and plans to follow up with [facility].  He is known there for receiving treatment in the past.  Patient denies SI/HI/AVH.  Patient plans to join the [place] meeting so he can maintain his sobriety.  He will be discharged now.  Consults:  None  Significant Diagnostic Studies:  labs: CBC, ALCOHOL LEVEL, UDS, CMP  Discharge Vitals:   Blood pressure 118/83, pulse 72, temperature 97.8 F (36.6 C), temperature source Oral, resp. rate 16, height _1  (1.727 m), weight 108.863 kg (240 lb), SpO2 99.00%. Body mass index is 36.5 kg/(m^2). Lab Results:   Results for orders placed during the hospital encounter of 09/16/13 (from the past 72 hour(s))  BASIC METABOLIC PANEL     Status: Abnormal   Collection Time    09/17/13  11:00 AM      Result Value Ref Range   Sodium 138  137 - 147 mEq/L   Potassium 4.0  3.7 - 5.3 mEq/L   Chloride 100  96 - 112 mEq/L   CO2 21  19 - 32 mEq/L   Glucose, Bld 92  70 - 99 mg/dL   BUN 13  6 - 23 mg/dL   Creatinine, Ser 0.77  0.50 - 1.35 mg/dL   Calcium 9.5  8.4 - 10.5 mg/dL   GFR calc non Af Amer >90  >90 mL/min   GFR calc Af Amer >90  >90 mL/min   Comment: (NOTE)     The eGFR has been calculated using the CKD EPI equation.     This calculation has not been validated in all clinical  situations.     eGFR's persistently <90 mL/min signify possible Chronic Kidney     Disease.   Anion gap 17 (*) 5 - 15  TROPONIN I     Status: None   Collection Time    09/17/13 11:12 AM      Result Value Ref Range   Troponin I <0.30  <0.30 ng/mL   Comment:            Due to the release kinetics of cTnI,     a negative result within the first hours     of the onset of symptoms does not rule out     myocardial infarction with certainty.     If myocardial infarction is still suspected,     repeat the test at appropriate intervals.  CBC     Status: Abnormal   Collection Time    09/17/13 11:13 AM      Result Value Ref Range   WBC 5.7  4.0 - 10.5 K/uL   RBC 5.29  4.22 - 5.81 MIL/uL   Hemoglobin 18.0 (*) 13.0 - 17.0 g/dL   HCT 50.1  39.0 - 52.0 %   MCV 94.7  78.0 - 100.0 fL   MCH 34.0  26.0 - 34.0 pg   MCHC 35.9  30.0 - 36.0 g/dL   RDW 14.9  11.5 - 15.5 %   Platelets 198  150 - 400 K/uL  POCT I-STAT TROPONIN I     Status: None   Collection Time    09/17/13 11:15 AM      Result Value Ref Range   Troponin i, poc 0.01  0.00 - 0.08 ng/mL   Comment 3            Comment: Due to the release kinetics of cTnI,     a negative result within the first hours     of the onset of symptoms does not rule out     myocardial infarction with certainty.     If myocardial infarction is still suspected,     repeat the test at appropriate intervals.    Physical Findings: AIMS: Facial and Oral Movements Muscles of Facial Expression: None, normal Lips and Perioral Area: None, normal Jaw: None, normal Tongue: None, normal,Extremity Movements Upper (arms, wrists, hands, fingers): None, normal Lower (legs, knees, ankles, toes): None, normal, Trunk Movements Neck, shoulders, hips: None, normal, Overall Severity Severity of abnormal movements (highest score from questions above): None, normal Incapacitation due to abnormal movements: None, normal Patient's awareness of abnormal movements (rate only  patient's report): No Awareness, Dental Status Current problems with teeth and/or dentures?: No Does patient usually wear dentures?: No  CIWA:  CIWA-Ar Total:  0 COWS:     Psychiatric Specialty Exam: See Psychiatric Specialty Exam and Suicide Risk Assessment completed by Attending Physician prior to discharge.  Discharge destination:  [facility]  Is patient on multiple antipsychotic therapies at discharge:  No   Has Patient had three or more failed trials of antipsychotic monotherapy by history:  No  Recommended Plan for Multiple Antipsychotic Therapies: NA  Discharge Instructions   Diet - low sodium heart healthy    Complete by:  As directed      Discharge instructions    Complete by:  As directed   Follow up with [facility] as planned or any other rehabilitation facility     Increase activity slowly    Complete by:  As directed             Medication List    STOP taking these medications       ATIVAN PO     multivitamin with minerals tablet     ondansetron 4 MG tablet  Commonly known as:  ZOFRAN     oxyCODONE-acetaminophen 5-325 MG per tablet  Commonly known as:  PERCOCET/ROXICET         Follow-up recommendations:  Activity:  As tolerated Diet:  regular  Comments:  Seen this am with Dr De Nurse.  Patient is safe and ready to go home and will follow up with [facility] on Tuesday  Total Discharge Time:  Greater than 30 minutes.  SignedDelfin Gant   PMHNP-BC 09/19/2013, 11:40 AM I have examined the patient and agree with the discharge plan and findings. I have also done suicide assessment on this patient.

## 2013-09-19 NOTE — Progress Notes (Signed)
Pt discharged from facility. No acute distress. Verbal understanding of discharge instructions including follow up care. All belongings returned. Denies SI, HI, AVH, Pain. Escorted to lobby.

## 2013-09-19 NOTE — [facility] Suicide Risk Assessment (Signed)
Suicide Risk Assessment  Discharge Assessment     Demographic Factors:  Male  Total Time spent with patient: 30 minutes  Psychiatric Specialty Exam:     Blood pressure 118/83, pulse 72, temperature 97.8 F (36.6 C), temperature source Oral, resp. rate 16, height  (1.727 m), weight 108.863 kg (240 lb), SpO2 99.00%.Body mass index is 36.5 kg/(m^2).  General Appearance: Casual  Eye Contact::  Fair  Speech:  Slow  Volume:  Normal  Mood:  Dysphoric  Affect:  Congruent  Thought Process:  Coherent  Orientation:  Full (Time, Place, and Person)  Thought Content:  Rumination  Suicidal Thoughts:  No  Homicidal Thoughts:  No  Memory:  Immediate;   Fair Recent;   Fair  Judgement:  Fair  Insight:  Fair  Psychomotor Activity:  Decreased  Concentration:  Fair  Recall:  [employer] of Knowledge:Fair  Language: Fair  Akathisia:  Negative  Handed:  Right  AIMS (if indicated):     Assets:  Communication Skills Desire for Improvement Financial Resources/Insurance Resilience  Sleep:       Musculoskeletal: Strength & Muscle Tone: within normal limits Gait & Station: normal Patient leans: N/A   Mental Status Per Nursing Assessment::   On Admission:     Current Mental Status by Physician: see MSE above  Loss Factors: Decrease in vocational status  Historical Factors: NA  Risk Reduction Factors:   Positive social support  Continued Clinical Symptoms:  Unstable or Poor Therapeutic Relationship  Cognitive Features That Contribute To Risk:  Polarized thinking    Suicide Risk:  Minimal: No identifiable suicidal ideation.  Patients presenting with no risk factors but with morbid ruminations; may be classified as minimal risk based on the severity of the depressive symptoms  Discharge Diagnoses:   AXIS I:  Substance Induced Mood Disorder and Alcohol use disorder, severe AXIS II:  Deferred AXIS III:   Past Medical History  Diagnosis Date  . Alcohol abuse   .  Anxiety   . Depression   . Hypertension    AXIS IV:  other psychosocial or environmental problems and problems with primary support group AXIS V:  51-60 moderate symptoms  Plan Of Care/Follow-up recommendations:  Activity:  as tolerated Diet:  regular Follow up with AA support groups and recommended appointements. Compliance with meds and recommendations.  Is patient on multiple antipsychotic therapies at discharge:  No   Has Patient had three or more failed trials of antipsychotic monotherapy by history:  No  Recommended Plan for Multiple Antipsychotic Therapies: NA    Jonathon Ray 09/19/2013, 11:02 AM

## 2013-09-19 NOTE — Progress Notes (Signed)
Nursing Shift Note OBS Denies SI, HI, AVH, pain and chest pain. Depressed mood. Cooperative with staff. Assertive interaction. Medication administered per protocol. Support and encouragement given. Safety maintained. Will continue to monitor and stabilize. No noted acute distress.

## 2013-10-16 NOTE — H&P (Signed)
[facility]/ Observation unit H/P NOTE   Reason for Consult:  Alcohol dependence Referring Physician:  EDP Jonathon CoolKevin [name]. Total Time spent with patient: 1 hour  Assessment: AXIS I:  Alcohol dependence, severe AXIS II:  Deferred AXIS III:   Past Medical History  Diagnosis Date  . Alcohol abuse   . Anxiety   . Depression   . Hypertension    AXIS IV:  occupational problems, other psychosocial or environmental problems and problems related to social environment AXIS V:  61-70 mild symptoms  Plan:  No evidence of imminent risk to self or others at present.   Discussed crisis plan, support from social network, calling 911, coming to the Emergency Department, and calling Suicide Hotline. [facility] and was treated thje[facility] last year.  Subjective:   Jonathon Ray is a 43 y.o. male patient admitted with Alcohol intoxication/depenedence.  HPI:  Patient was admitted for observation in our observation unit for alcohol dependence and intoxication.  Patient was sober for 11 months but relapsed in August when his girl friend suddenly left him.  Patient states he did not know how to deal with the breakup than relapsing on Alcohol. He reports drinking 7-8 shots of Vodka daily.  [facility] and plans to go back there for rehabilitation.  Patient denies any mental illness diagnosis.  Patient denies  SI/HI/AVH and he does not have hx of previous SA .  Patient started drinking alcohol at age 43 and have been drinking off and on.  He has mild tremors of his fingers at this time and he denies hx of withdrawal seizures. [facility]  He has reported chest pain early this am and was sent to the ER for assessment.  Based on the complaint of chest pain patient will be kept overnight for closer monitoring.  We will reevaluate again and determine appropriate disposition.  We  are using our Librium protocol for his detox.  HPI Elements:   Location:  Alcohol intoxication/Dependence. Quality:  moderate, tremors of his fingers, tired and sleepy from taking Librium, angry and disappointed for rwelapsing.. Severity:  moderate. Context:  Breaking up with girl friend, started using alcohol after 11 months of sobriety.  Past Psychiatric History: Past Medical History  Diagnosis Date  . Alcohol abuse   . Anxiety   . Depression   . Hypertension     reports that he has been smoking.  He has never used smokeless tobacco. He reports that he drinks about 5 ounces of alcohol per week. He reports that he does not use illicit drugs. History reviewed. No pertinent family history.   Living Arrangements: Alone   Abuse/Neglect [facility]) Physical Abuse: Denies Verbal Abuse: Denies Sexual Abuse: Denies Allergies:  No Known Allergies  ACT Assessment Complete:  Yes:    Educational Status    Risk to Self: Risk to self with the past 6 months Is patient at risk for suicide?: No Substance abuse history and/or treatment for substance abuse?: Yes  Risk to Others:    Abuse: Abuse/Neglect Assessment (Assessment to be complete while patient is alone) Physical Abuse: Denies Verbal Abuse: Denies Sexual Abuse: Denies Exploitation of patient/patient's resources: Denies Self-Neglect: Denies  Prior Inpatient Therapy:    Prior Outpatient Therapy:    Additional Information:      Objective: Blood pressure 118/83, pulse 72, temperature 97.8 F (36.6 C), temperature  source Oral, resp. rate 16, height 5\' 8"  (1.727 m), weight 108.863 kg (240 lb), SpO2 99.00%.Body mass index is 36.5 kg/(m^2). No results found for this or any previous visit (from the past 72 hour(s)). Labs are reviewed and are pertinent for Alcohol level of 254 on arrival on the 10 th of September.  No current facility-administered medications for this encounter.   No current outpatient prescriptions on file.     Psychiatric Specialty Exam:     Blood pressure 118/83, pulse 72, temperature 97.8 F (36.6 C), temperature source Oral, resp. rate 16, height 5\' 8"  (1.727 m), weight 108.863 kg (240 lb), SpO2 99.00%.Body mass index is 36.5 kg/(m^2).  General Appearance: Casual  Eye Contact::  Good  Speech:  Clear and Coherent and Normal Rate  Volume:  Normal  Mood:  Depressed  Affect:  Depressed and Flat  Thought Process:  Coherent, Goal Directed and Intact  Orientation:  Full (Time, Place, and Person)  Thought Content:  WDL  Suicidal Thoughts:  No  Homicidal Thoughts:  No  Memory:  Immediate;   Good Recent;   Good Remote;   Good  Judgement:  Fair  Insight:  Good  Psychomotor Activity:  Tremor  Concentration:  Good  Recall:  NA  Fund of Knowledge:Good  Language: Good  Akathisia:  NA  Handed:  Right  AIMS (if indicated):     Assets:  Desire for Improvement  Sleep:      Musculoskeletal: Strength & Muscle Tone: within normal limits Gait & Station: normal Patient leans: N/A  Treatment Plan Summary:  Patient will be monitored for one more day overnight per Dr Gilmore LarocheAkhtar.  We will reevaluate in am and determine appropriate disposition. Patient will be discharged home [facility]  Earney NavyONUOHA, [name], C   PMHNP-BC 10/16/2013 3:10 PM Agree with plan.

## 2013-10-18 NOTE — H&P (Signed)
I have reveiwed the notes and agree with the assessment.

## 2016-01-02 IMAGING — CR DG CHEST 1V PORT
1 series · 1 of 1 positions shown · non-contrast
Comparison: 10/22/2012.

CLINICAL DATA: 43-year-old male with chest pain, pressure,
headaches. Vomiting and diarrhea earlier this week. Initial
encounter.

EXAM:
PORTABLE CHEST - 1 VIEW

[AP]
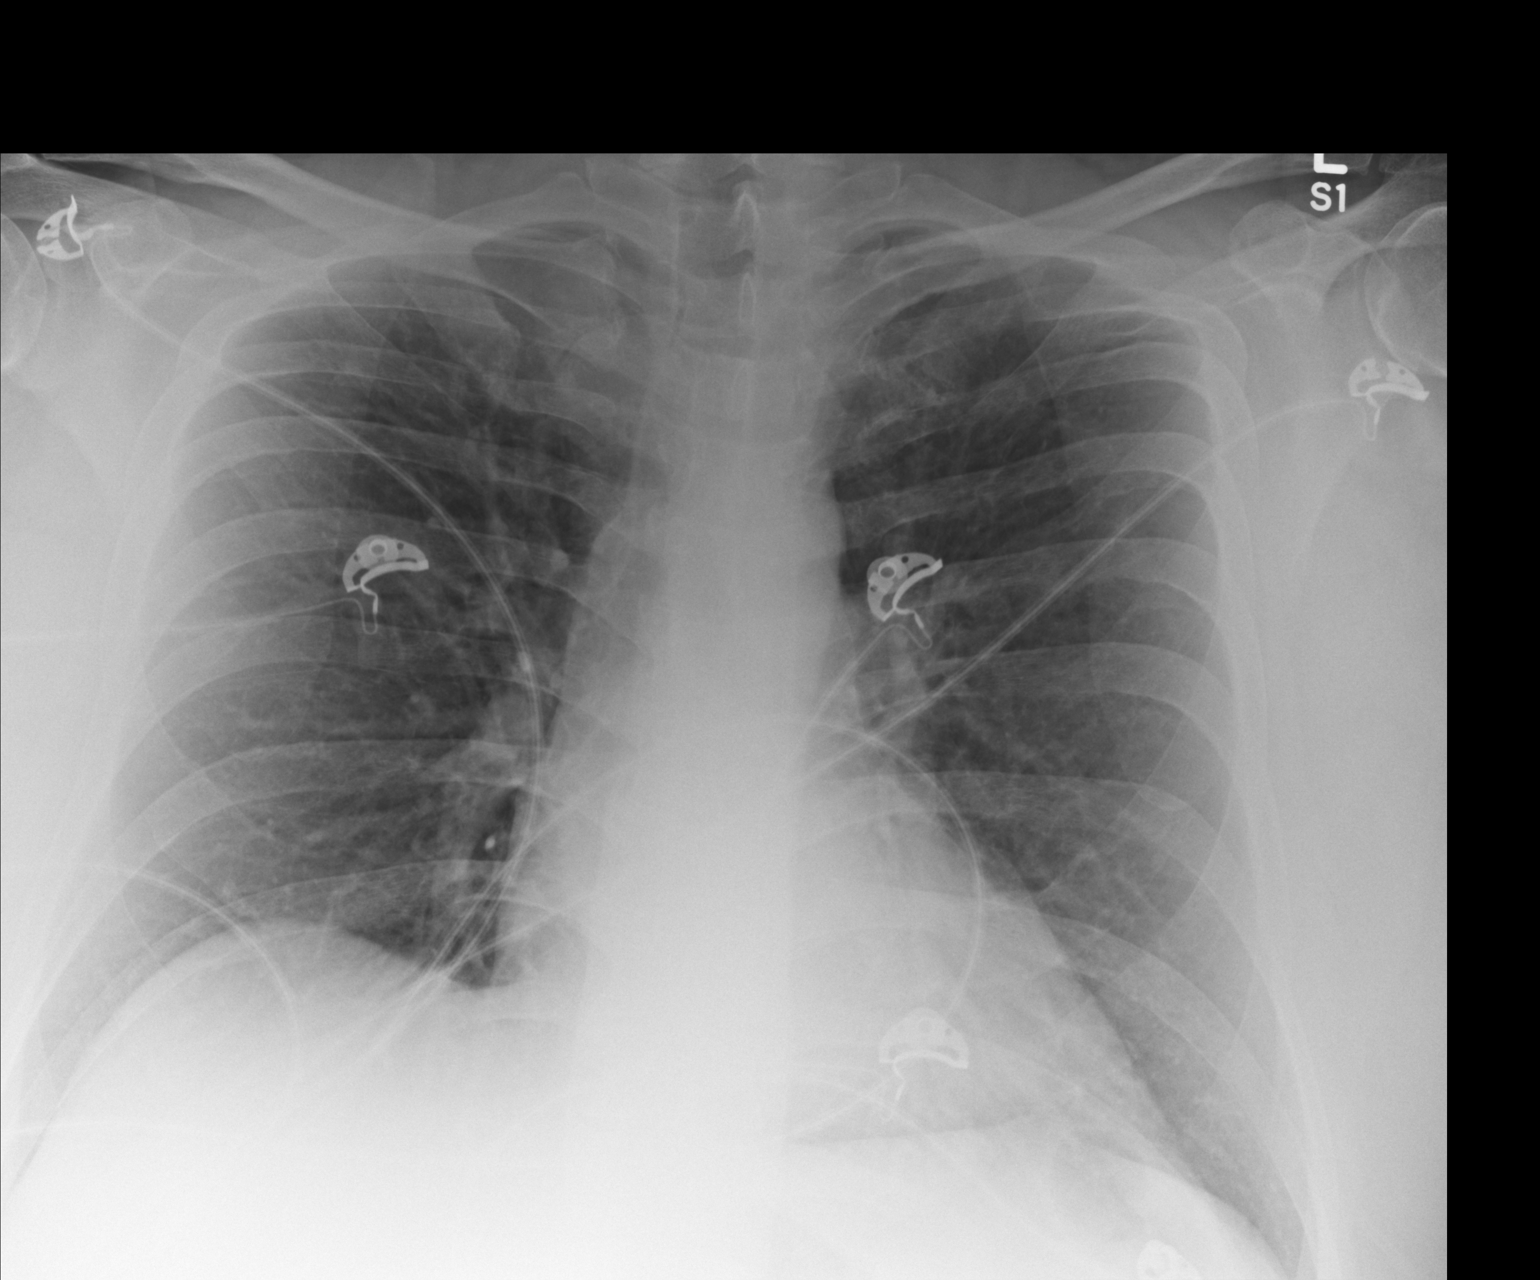

[1 of 1 positions shown; findings below may reference images not displayed]

FINDINGS: Portable AP semi upright view at 7977 hrs. Lung volumes are stable,
within normal limits. Mild elevation the right hemidiaphragm. Normal
cardiac size and mediastinal contours. Visualized tracheal air
column is within normal limits. Allowing for portable technique, the
lungs are clear.
IMPRESSION: No acute cardiopulmonary abnormality.

## 2016-01-02 IMAGING — CT CT HEAD W/O CM
1 of 2 series · 16 of 30 positions shown, 20 images · non-contrast
Comparison: None.

CLINICAL DATA: Migraine.

EXAM:
CT HEAD WITHOUT CONTRAST
TECHNIQUE: Contiguous axial images were obtained from the base of the skull
through the vertex without intravenous contrast.

[Series 3: head 2.0 h70h · axial · 0.47mm/px · z∈[-133,+25]mm · 16 of 89 slices shown, 20 images]
[im 5/89  brain]
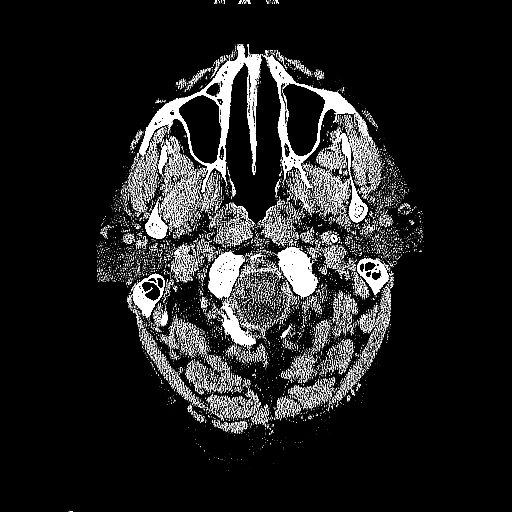
[im 5/89  bone]
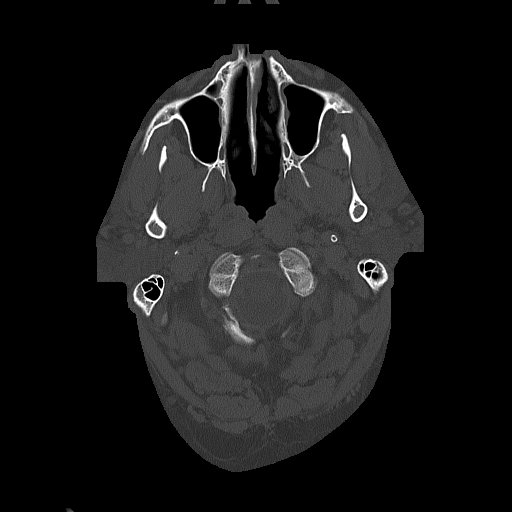
[im 9/89  brain]
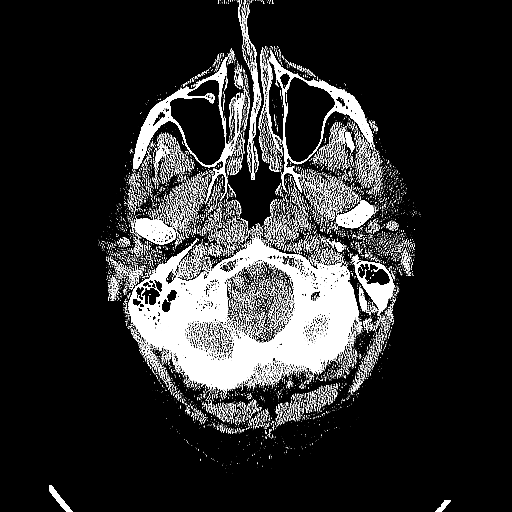
[im 14/89  brain]
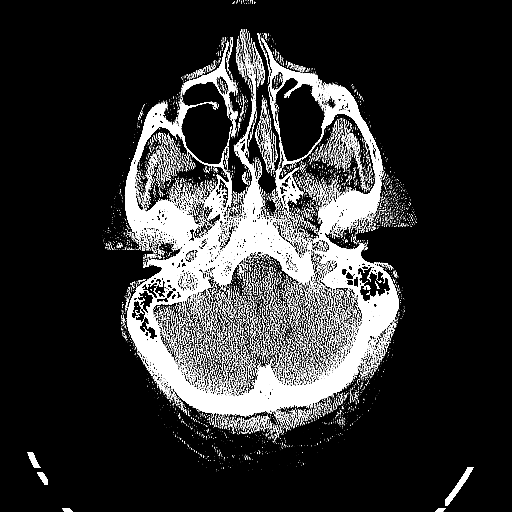
[im 23/89  brain]
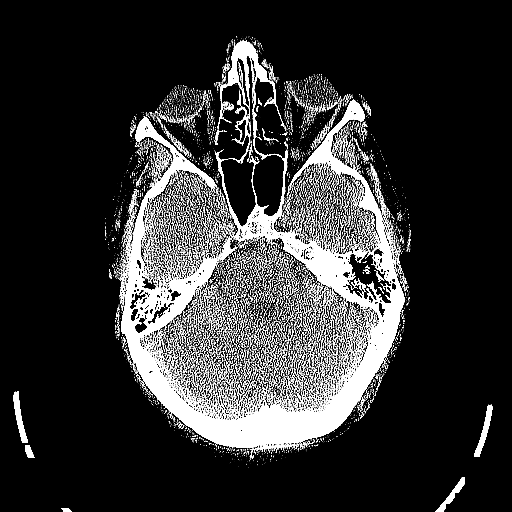
[im 27/89  brain]
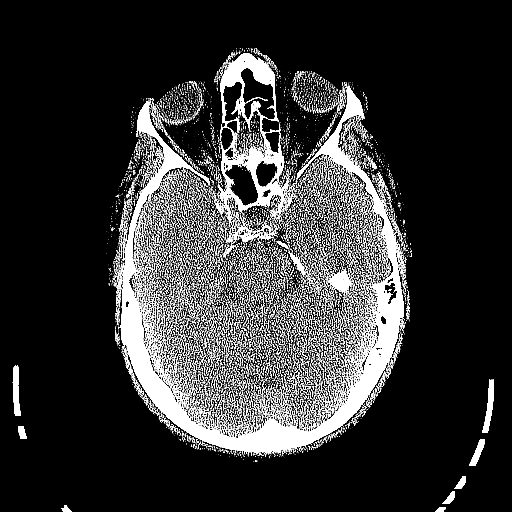
[im 27/89  bone]
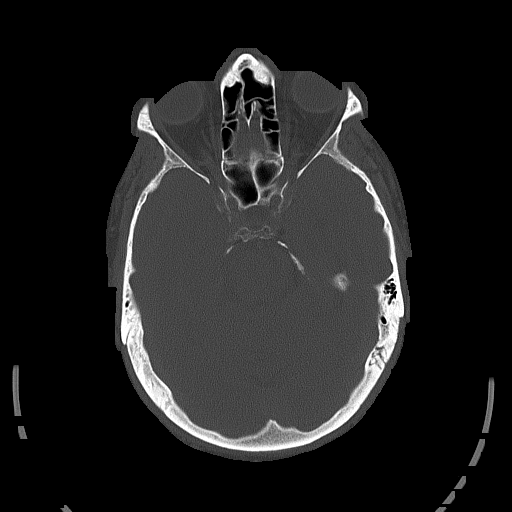
[im 31/89  brain]
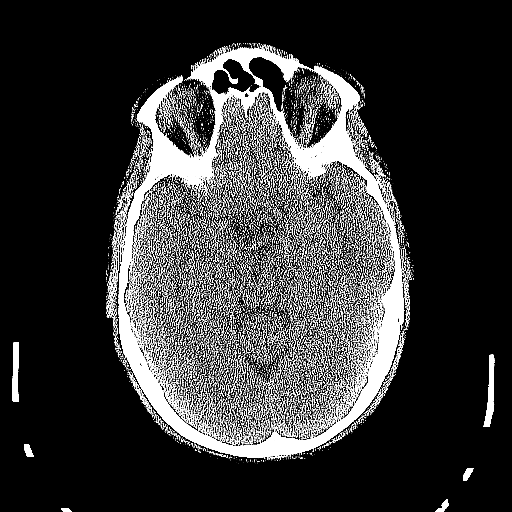
[im 36/89  brain]
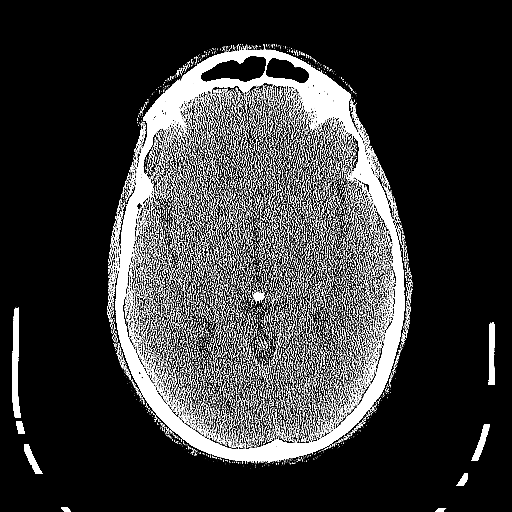
[im 40/89  brain]
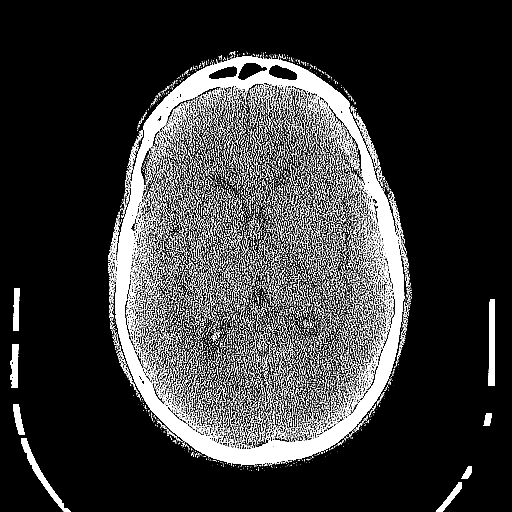
[im 49/89  brain]
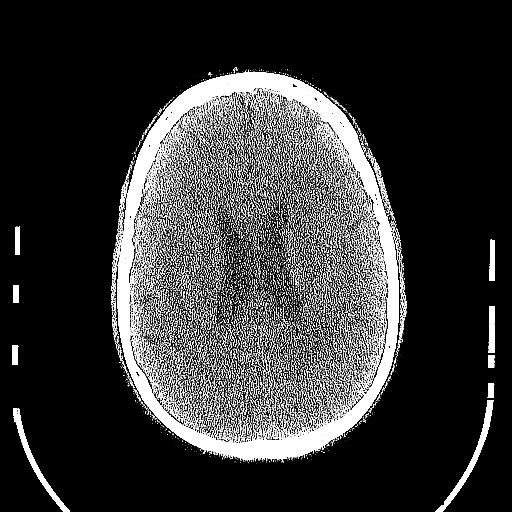
[im 49/89  bone]
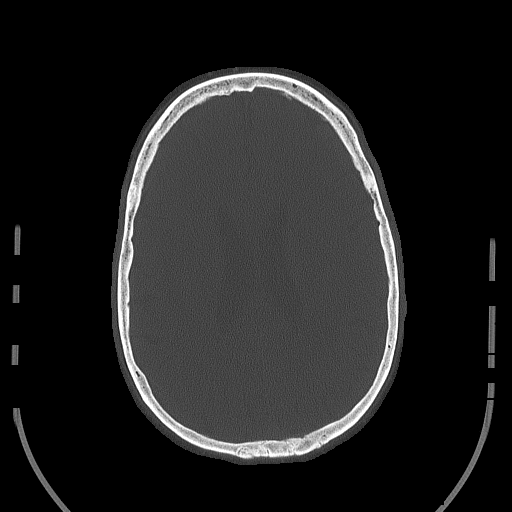
[im 53/89  brain]
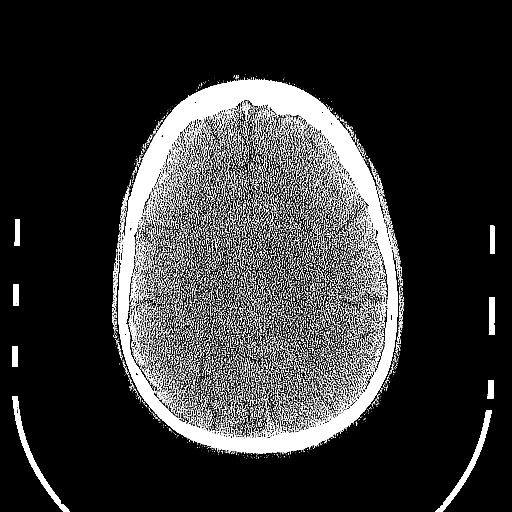
[im 58/89  brain]
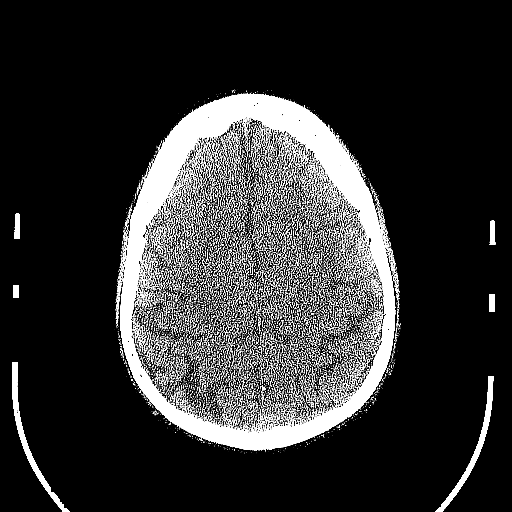
[im 62/89  brain]
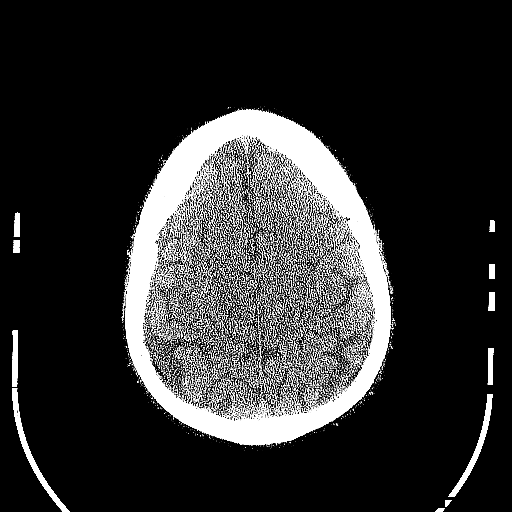
[im 67/89  brain]
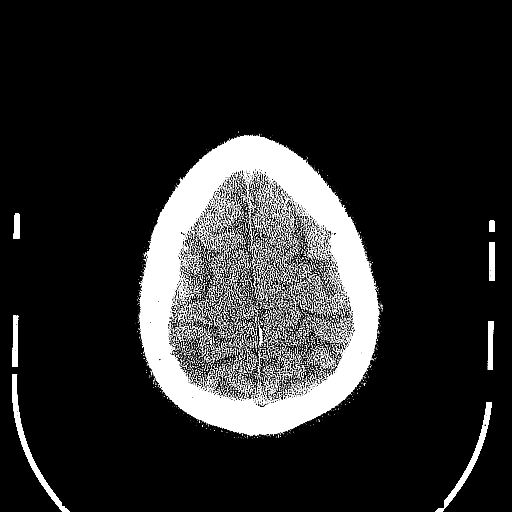
[im 67/89  bone]
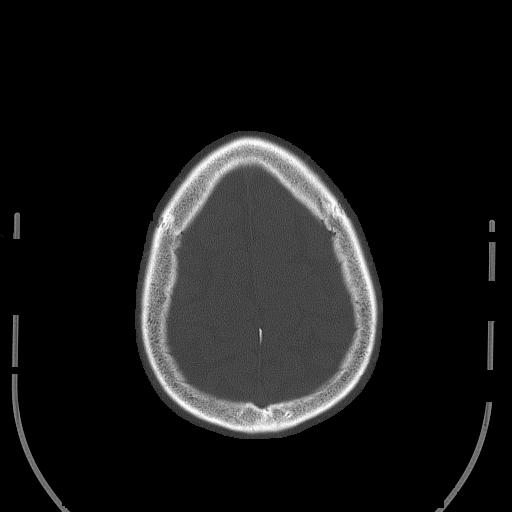
[im 75/89  brain]
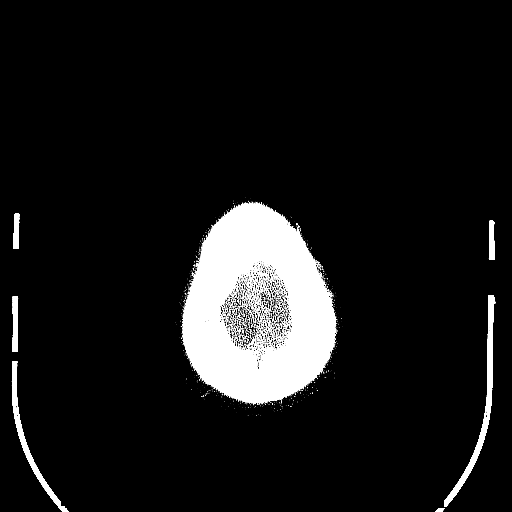
[im 80/89  brain]
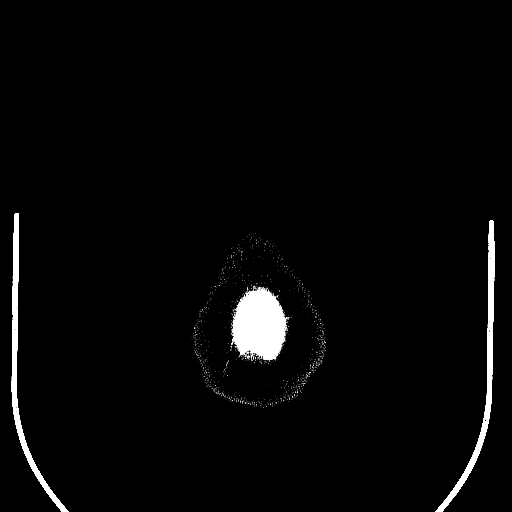
[im 84/89  brain]
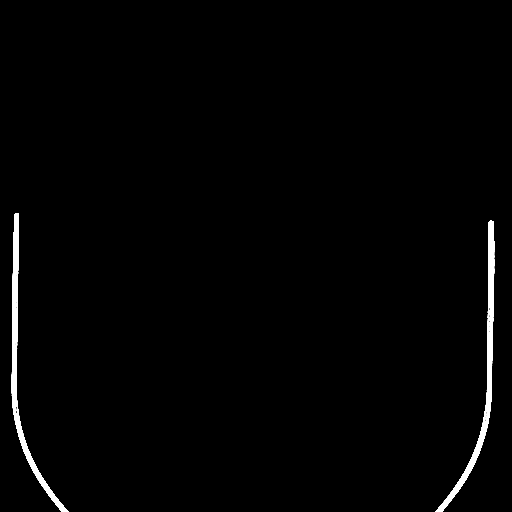

[16 of 30 positions shown; findings below may reference images not displayed]

FINDINGS: No acute intracranial abnormality. Specifically, no hemorrhage,
hydrocephalus, mass lesion, acute infarction, or significant
intracranial injury. No acute calvarial abnormality.

Mucosal thickening within the maxillary sinuses and ethmoid air
cells. No air-fluid levels. Mastoid air cells are clear. Orbital
soft tissues are unremarkable.
IMPRESSION: No acute intracranial abnormality.

Chronic sinusitis.

## 2017-04-07 DEATH — deceased
